# Patient Record
Sex: Female | Born: 1955 | Hispanic: No | Marital: Married | Smoking: Former smoker
Health system: Southern US, Community
[De-identification: ages and names within clinical notes are randomized; demographics above are authoritative.]

## PROBLEM LIST (undated history)

## (undated) DIAGNOSIS — R55 Syncope and collapse: Secondary | ICD-10-CM

## (undated) DIAGNOSIS — Z9289 Personal history of other medical treatment: Secondary | ICD-10-CM

## (undated) HISTORY — DX: Syncope and collapse: R55

## (undated) HISTORY — DX: Personal history of other medical treatment: Z92.89

## (undated) HISTORY — PX: APPENDECTOMY: SHX54

## (undated) HISTORY — PX: PARTIAL HYSTERECTOMY: SHX80

---

## 2008-07-29 ENCOUNTER — Encounter (INDEPENDENT_AMBULATORY_CARE_PROVIDER_SITE_OTHER): Payer: Self-pay | Admitting: General Surgery

## 2008-07-29 ENCOUNTER — Inpatient Hospital Stay (HOSPITAL_COMMUNITY): Admission: RE | Admit: 2008-07-29 | Discharge: 2008-08-01 | Payer: Self-pay | Admitting: General Surgery

## 2009-06-20 HISTORY — PX: COLON SURGERY: SHX602

## 2011-02-02 NOTE — Op Note (Signed)
Kristy Myers, Kristy Myers                ACCOUNT NO.:  [id number]   MEDICAL RECORD NO.:  [id number]          PATIENT TYPE:  INP   LOCATION:  NA                           FACILITY:  [facility]   PHYSICIAN:  Angelia Mould. Derrell Lolling, M.D.DATE OF BIRTH:  Dec 12, 1955   DATE OF PROCEDURE:  07/29/2008  DATE OF DISCHARGE:                               OPERATIVE REPORT   PREOPERATIVE DIAGNOSIS:  Villous adenoma of the cecum.   POSTOPERATIVE DIAGNOSIS:  Villous adenoma of the cecum.   OPERATION PERFORMED:  Laparoscopic-assisted right colectomy.   SURGEON:  Dr. Claud Kelp.   ASSISTANT:  Dr. Karie Soda.   OPERATIVE INDICATIONS:  This is a 55 year old white female who lost 8  pounds in 2 months and was otherwise feeling well.  Father died of  prostate cancer.  Recently she decided to get her first ever screening  colonoscopy.  Dr. Randa Evens performed a colonoscopy and found a villous  adenoma in the cecum related to the orifice of the appendix.  Biopsy  showed villous adenoma, no dysplasia.  She was advised to have this area  resected.  She has undergone a bowel prep at home and is brought to the  operating room electively.   OPERATIVE FINDINGS:  Visually there were no abnormal findings of the  liver, stomach, duodenum, small intestine, large intestine, or pelvis.  There were almost no adhesions in the pelvis from her previous ovarian  surgery.  She had a soft palpable mass in the cecum, about 2-3 cm in  diameter.  Dr. Colonel Bald in the pathology lab inspected the specimen.  It  seemed to have excellent margins on each side and that the villous  adenoma appeared to be about 3 cm in diameter.   OPERATIVE TECHNIQUE:  Following induction of general endotracheal  anesthesia, a Foley catheter was inserted.  The patient's abdomen was  prepped and draped in sterile fashion.  A time-out was held and the  patient was identified as the correct patient and the correct procedure.  Intravenous antibiotics were given.   The short vertically-oriented  incision was made at the superior rim of the umbilicus.  The fascia was  incised in the midline and the abdominal cavity entered under direct  vision.  An 11 mm Hassan trocar was inserted and secured with a  pursestring suture of 0 Vicryl.  Pneumoperitoneum was created.  Video  camera was inserted with visualization and findings as described above.  A 5 mm trocar was placed in the suprapubic area through the previous  Pfannenstiel incision.  A 5 mm trocar was placed in the right mid  abdomen, a 5 mm trocar in the left mid abdomen and 5 mm  trocar in the  upper midline about 6 cm away from the [place] trocar.   The patient was positioned.  We examined the terminal ileum and cecum.  Using the Harmonic scalpel and blunt dissection,  we mobilized the  terminal ileum and cecum away from lateral pelvic sidewall.  We  continued this dissection cephalad all the way up to the hepatic flexure  and we were able to  mobilize and sweep the right colon to the midline.  We then repositioned the patient and mobilized the right transverse  colon by dividing its lateral peritoneal attachments.  We were very  careful to identify the edge of the gallbladder and the duodenum as we  went through this and took this in small steps, but ultimately we were  able to fully mobilize the hepatic flexure down and away from the liver  and away from the duodenum.  We had the duodenum was completely  visualized.  At this point we judged our mobilization and we could bring  the cecum all the way up into the left upper quadrant.  The  pneumoperitoneum was released.  Trocars in the midline were removed.  The incision above the umbilicus extended cephalad for a distance about  6 or 7 cm.  The fascia was incised in the midline.  A self retaining  retractor was placed.  We delivered the terminal ileum and right colon  into the wound.  We could identify the ileocolic vessels, the right  colic  vessels and the right and left branch of the middle colic vessel.  We created a window just to the right of the right branch of the middle  colic vessel.  We transected the right transverse colon with a GIA  stapling device.  We then cleaned off the mesentery of the terminal  ileum about 2 inches proximal to the ileocecal valve, and we transected  the terminal ileum with a GIA stapling device.  We took the mesentery  widely down for a very proximal ligation of the ileocolic vessels.  Smaller mesenteric vessels were controlled with the LigaSure device.  The large ileocolic was clamped, divided and ligated with 2-0 silk ties.   The specimen was removed.  Specimen was sent to the lab.  Dr. Colonel Bald  did  a gross inspection of the  specimen as described above.  We had good  margins and a 3 cm tumor in the cecum.   Anastomosis was created between the terminal ileum and the mid  transverse colon using a GIA stapling device.  The defect of the bowel  wall was closed with TA-60 stapling device.   At this point we changed our instruments and gloves and suction devices.  A few 3-0 silk sutures were placed to reinforce the staple line at  critical points.  There was no bleeding from the staple line.  It  appeared intact.  The mesentery was closed with multiple interrupted  figure-of-eight sutures of 3-0 silk.  We irrigated off the specimen,  returned to the abdominal cavity.  The midline fascia was closed with  running suture of #1 double-stranded PDS and skin closed with skin  staples.  Pneumoperitoneum was recreated.  We inserted a 5 mm camera to  inspect all the areas of operation.  There was absolutely no blood and  almost no fluid which was evacuated.  We could see the anastomosis.  We  could see the terminal ileum and there was no twisting or kinking  anywhere.  We removed what little irrigation fluid there was and placed  the omentum down on top of anastomosis.  The pneumoperitoneum was   released.  This trocars were removed.  All the trocar sites were closed  with skin staples as well.   The patient tolerated the procedure well and was taken recovery room in  stable condition.  Estimated blood loss was about 75 mL or less.  Complications none.  Sponge, needle and instrument counts were correct.      Angelia Mould. Derrell Lolling, M.D.  Electronically Signed     HMI/MEDQ  D:  07/29/2008  T:  07/29/2008  Job:  [id number]   cc:   Fayrene Fearing L. Malon Kindle., M.D.  Fax: [phone]   Delaney Meigs, M.D.  Fax: [phone]

## 2011-02-05 NOTE — Discharge Summary (Signed)
Kristy Myers, Kristy Myers                ACCOUNT NO.:  [id number]   MEDICAL RECORD NO.:  [id number]          PATIENT TYPE:  INP   LOCATION:  1529                         FACILITY:  [facility]   PHYSICIAN:  Angelia Mould. 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   OPERATION PERFORMED:  Laparoscopic assisted right colectomy date  July 29, 2008.   HISTORY:  This is a 55 year old white female who is asymptomatic.  She  elected to have screening colonoscopy.  Dr. Randa Evens performed this and  he found a prominent area at the orifice of the appendix and in the  cecum.  Biopsy showed tubulovillous adenoma and no dysplasia was noted  on that biopsy.  He could not resect this area because of its location  and its sessile nature.  She was sent to me and counseled as an  outpatient.  Surgery was advised.  She underwent a bowel prep at home  and was brought to the operating room electively.   HOSPITAL COURSE:  On the day of admission the patient was taken to the  operating room and underwent a laparoscopic assisted right colectomy.  We found a 3 cm tumor in the cecum.  The final pathology report showed a  2.5 cm x 1.6 x 0.5 cm sessile tubulovillous adenoma with one focus of  high-grade dysplasia.  There was no invasive cancer identified.  Thirty  colonic lymph nodes were negative for tumor.   Postoperatively the patient did well.  Her pathology report was  discussed with her.  She advanced in diet and activities and was ready  for discharge on August 01, 2008.  At that time she had had one bowel  movement and was voiding reasonably well and ambulating in the halls and  wanted to go home.  Her wounds looked good.  She was given a  prescription for Vicodin for pain.  She was told to continue her  usual  medications which are none and she was asked to return to see me in the  office in 1 week.      Angelia Mould. Derrell Lolling, M.D.  Electronically Signed     HMI/MEDQ  D:  08/23/2008  T:  08/23/2008  Job:  [id number]   cc:   Fayrene Fearing L. Malon Kindle., M.D.  Fax: [phone]   Delaney Meigs, M.D.  Fax: [phone]

## 2011-04-30 ENCOUNTER — Emergency Department (HOSPITAL_COMMUNITY): Payer: BC Managed Care – PPO

## 2011-04-30 ENCOUNTER — Emergency Department (HOSPITAL_COMMUNITY)
Admission: EM | Admit: 2011-04-30 | Discharge: 2011-04-30 | Disposition: A | Discharge status: Home / Self Care | Payer: BC Managed Care – PPO | Attending: Emergency Medicine | Admitting: Emergency Medicine

## 2011-04-30 DIAGNOSIS — M25579 Pain in unspecified ankle and joints of unspecified foot: Secondary | ICD-10-CM | POA: Insufficient documentation

## 2011-04-30 DIAGNOSIS — R55 Syncope and collapse: Secondary | ICD-10-CM | POA: Insufficient documentation

## 2011-04-30 DIAGNOSIS — S82899A Other fracture of unspecified lower leg, initial encounter for closed fracture: Secondary | ICD-10-CM | POA: Insufficient documentation

## 2011-04-30 DIAGNOSIS — R296 Repeated falls: Secondary | ICD-10-CM | POA: Insufficient documentation

## 2011-04-30 DIAGNOSIS — M79609 Pain in unspecified limb: Secondary | ICD-10-CM | POA: Insufficient documentation

## 2011-04-30 DIAGNOSIS — S92919A Unspecified fracture of unspecified toe(s), initial encounter for closed fracture: Secondary | ICD-10-CM | POA: Insufficient documentation

## 2011-04-30 DIAGNOSIS — R51 Headache: Secondary | ICD-10-CM | POA: Insufficient documentation

## 2011-04-30 LAB — DIFFERENTIAL
Basophils Absolute: 0 10*3/uL (ref 0.0–0.1)
Eosinophils Relative: 2 % (ref 0–5)
Monocytes Absolute: 0.7 10*3/uL (ref 0.1–1.0)
Monocytes Relative: 7 % (ref 3–12)
Neutro Abs: 5.6 10*3/uL (ref 1.7–7.7)

## 2011-04-30 LAB — POCT I-STAT, CHEM 8
BUN: 10 mg/dL (ref 6–23)
Calcium, Ion: 1.18 mmol/L (ref 1.12–1.32)
Creatinine, Ser: 0.9 mg/dL (ref 0.50–1.10)
Potassium: 4.1 mEq/L (ref 3.5–5.1)

## 2011-04-30 LAB — CBC
HCT: 38.7 % (ref 36.0–46.0)
Hemoglobin: 13.2 g/dL (ref 12.0–15.0)
MCV: 93 fL (ref 78.0–100.0)
RDW: 13.3 % (ref 11.5–15.5)

## 2011-04-30 LAB — POCT I-STAT TROPONIN I: Troponin i, poc: 0 ng/mL (ref 0.00–0.08)

## 2011-06-11 DIAGNOSIS — Z9289 Personal history of other medical treatment: Secondary | ICD-10-CM

## 2011-06-11 HISTORY — DX: Personal history of other medical treatment: Z92.89

## 2011-06-23 LAB — COMPREHENSIVE METABOLIC PANEL
ALT: 12
Albumin: 3.9
Alkaline Phosphatase: 50
BUN: 9
CO2: 29
Calcium: 9.1
Glucose, Bld: 87
Sodium: 141

## 2011-06-23 LAB — CBC
HCT: 37
HCT: 38.6
MCHC: 34
Platelets: 245
Platelets: 287
RDW: 12.8
RDW: 12.8
WBC: 15.7 — ABNORMAL HIGH
WBC: 7.3

## 2011-06-23 LAB — DIFFERENTIAL
Basophils Absolute: 0
Lymphocytes Relative: 39

## 2011-06-23 LAB — BASIC METABOLIC PANEL
BUN: 8
Calcium: 8.6
Glucose, Bld: 163 — ABNORMAL HIGH
Sodium: 134 — ABNORMAL LOW

## 2011-06-23 LAB — URINALYSIS, ROUTINE W REFLEX MICROSCOPIC
Urobilinogen, UA: 1
pH: 6.5

## 2011-06-23 LAB — URINE MICROSCOPIC-ADD ON

## 2012-07-30 IMAGING — CR DG FOOT COMPLETE 3+V*L*
4 series · 4 of 4 positions shown · non-contrast
Comparison: None.

CLINICAL DATA: Bilateral foot pain.  Right-sided ankle pain.
Syncopal episode.

LEFT FOOT - COMPLETE 3+ VIEW

[x foot ap left]
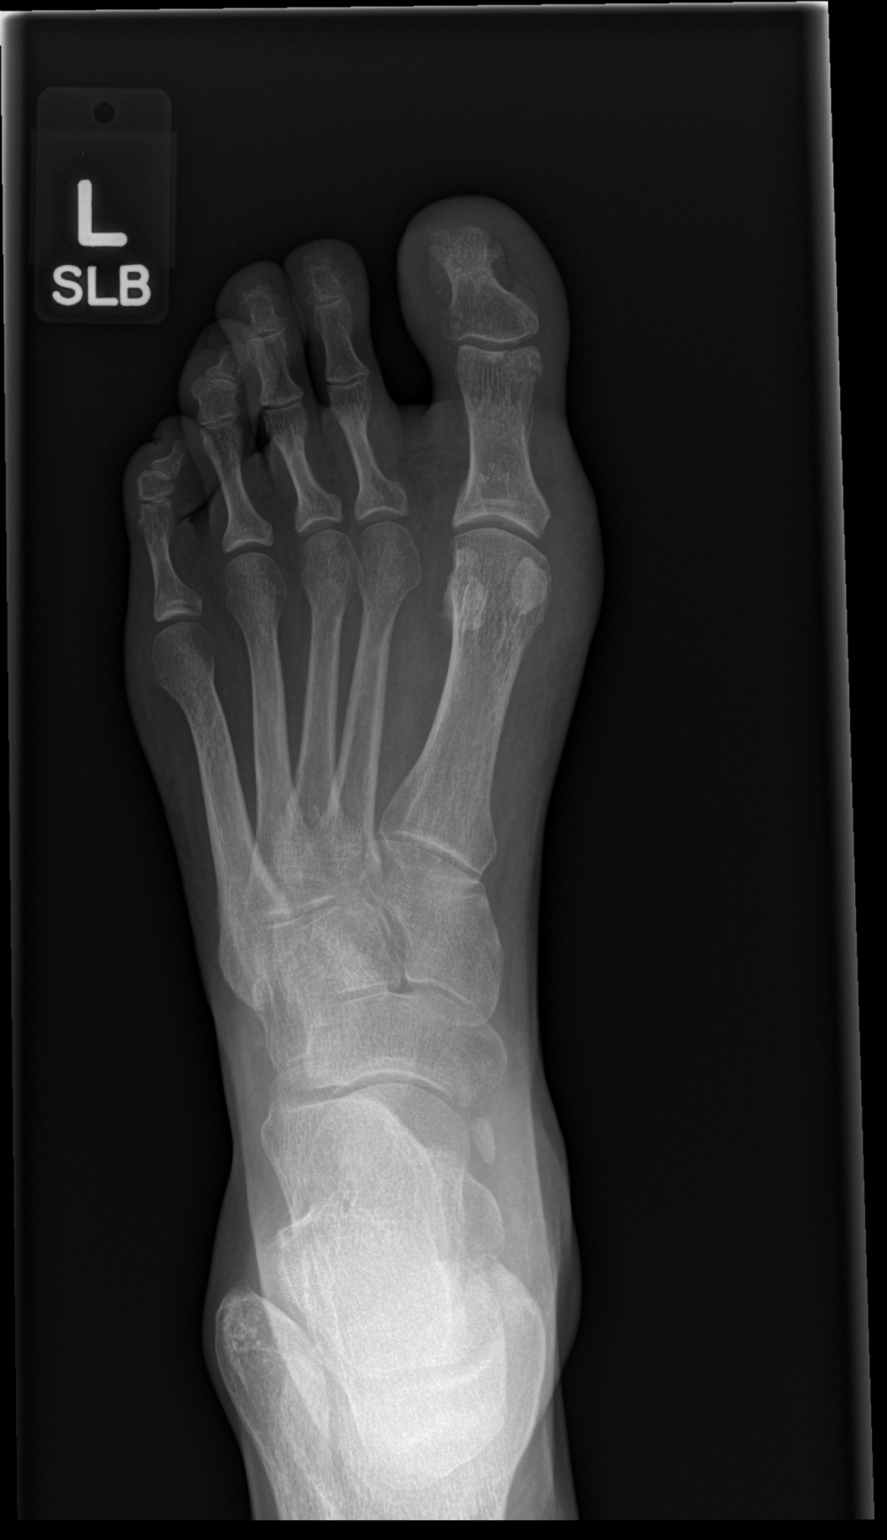

[x foot obl left (1 of 2)]
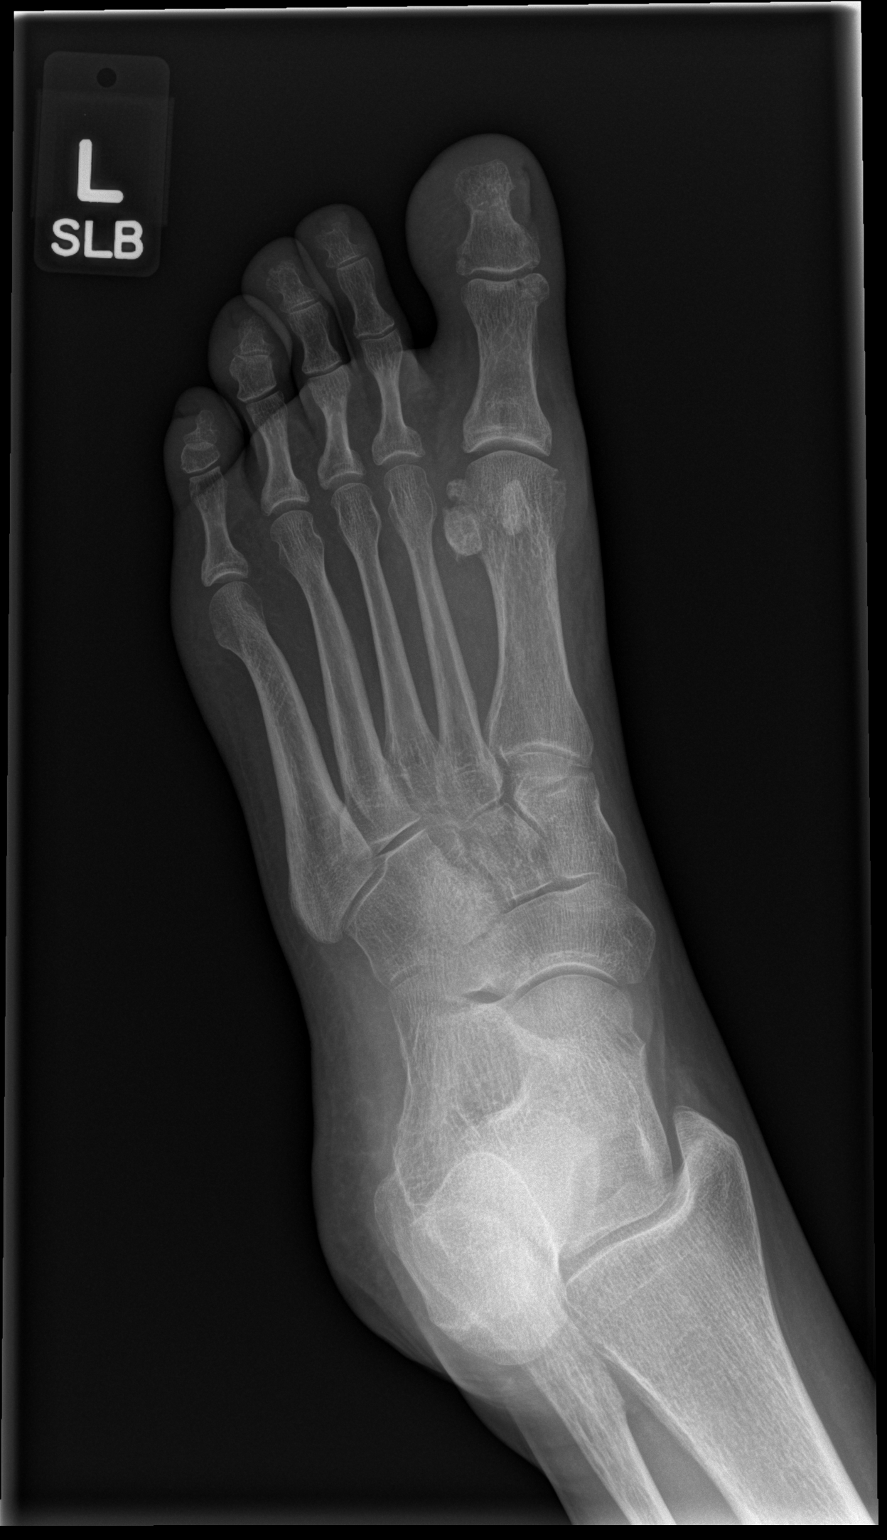

[x foot obl left (2 of 2)]
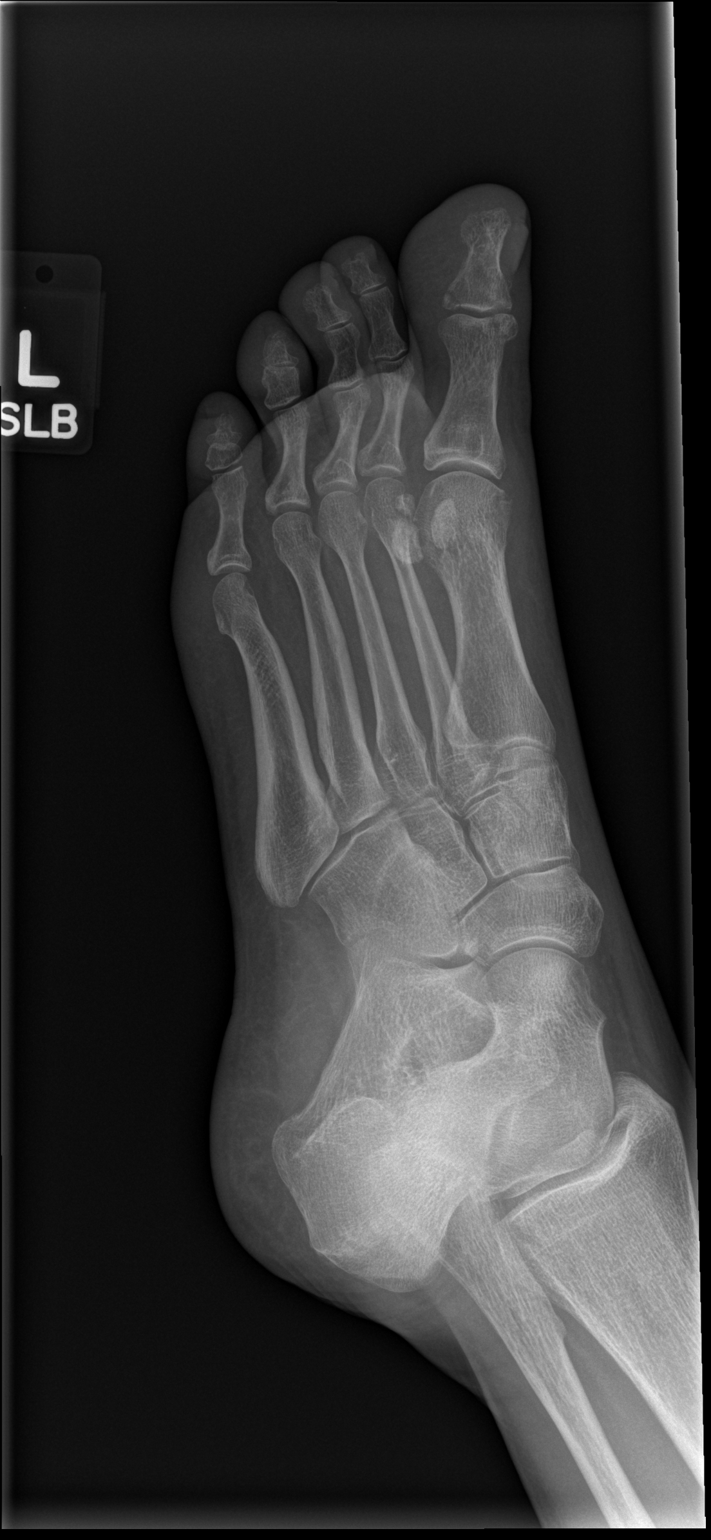

[x foot lat left]
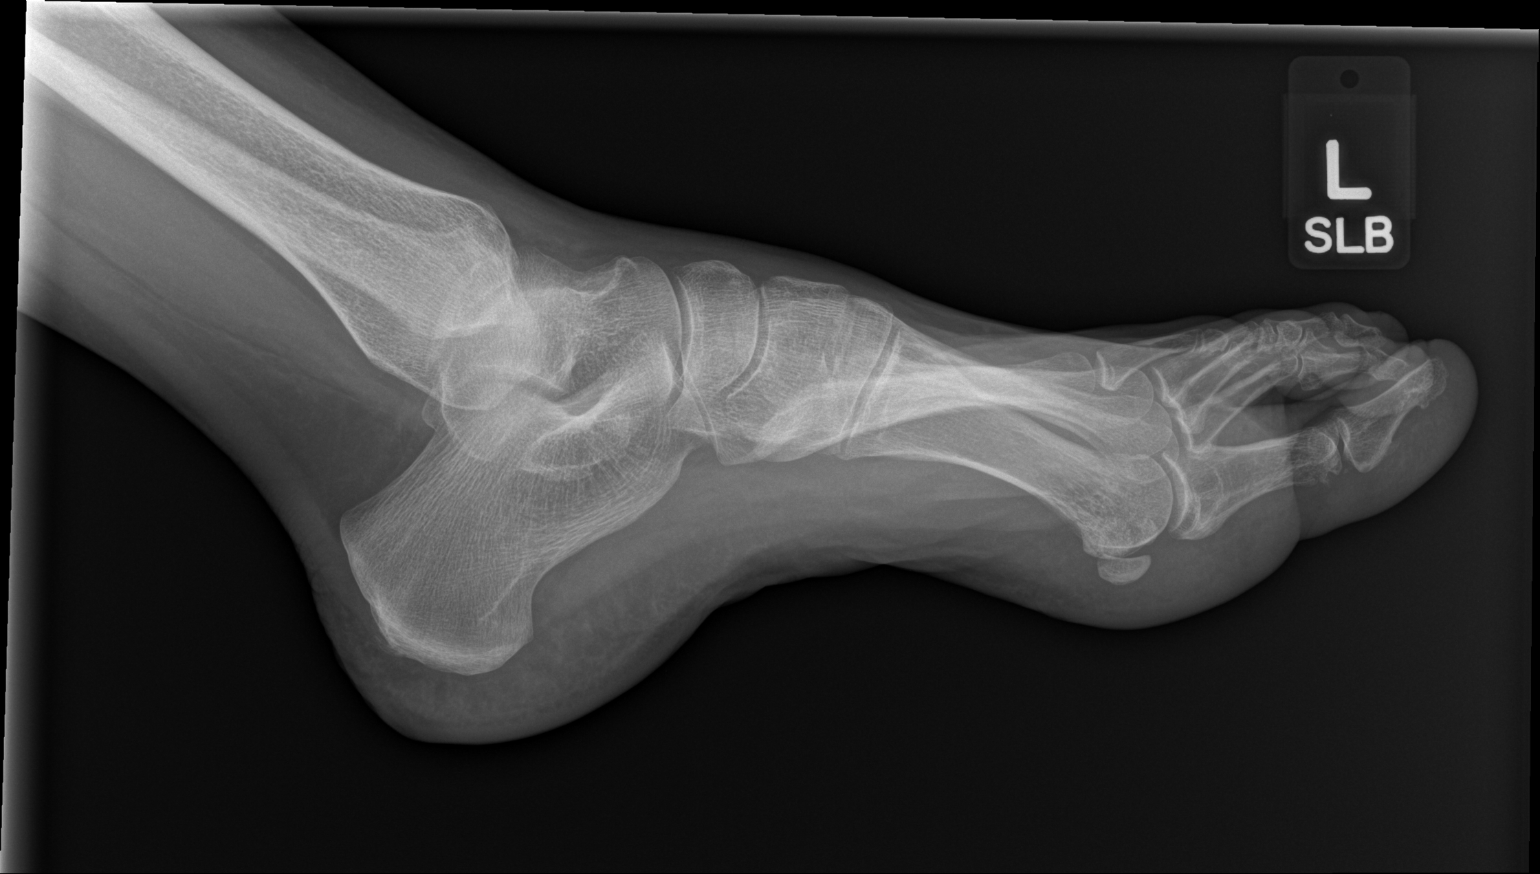

[4 of 4 positions shown; findings below may reference images not displayed]

FINDINGS: Nondisplaced fracture of the medial distal aspect of the
proximal phalanx of the left great toe with intra-articular
extension.  Minimal cortical step-off deformity is present of the
articular surface at the great toe IP joint.  No other fractures
are identified.  Soft tissue swelling is present over the medial
aspect of the great toe.  Probable bipartite lateral great toe
sesamoid versus old sesamoid fracture.

There is another fracture of the lateral and plantar surface of the
terminal phalanx of the great toe with intra-articular extension.
No cortical step-off deformity.
IMPRESSION: 1. Nondisplaced fracture of the distal proximal phalanx of the left
great toe with intra-articular extension and mild cortical step-
off.  Cortical step-off measures under 1 mm.
2.  Nondisplaced fracture of the lateral aspect of the terminal
phalanx of the left great toe with intra-articular extension.

## 2013-03-12 ENCOUNTER — Other Ambulatory Visit (HOSPITAL_COMMUNITY): Payer: Self-pay | Admitting: Family Medicine

## 2013-03-12 DIAGNOSIS — R634 Abnormal weight loss: Secondary | ICD-10-CM

## 2013-03-14 ENCOUNTER — Ambulatory Visit (HOSPITAL_COMMUNITY)
Admission: RE | Admit: 2013-03-14 | Discharge: 2013-03-14 | Disposition: A | Discharge status: Home / Self Care | Payer: BC Managed Care – PPO | Source: Ambulatory Visit | Attending: Family Medicine | Admitting: Family Medicine

## 2013-03-14 DIAGNOSIS — N281 Cyst of kidney, acquired: Secondary | ICD-10-CM | POA: Insufficient documentation

## 2013-03-14 DIAGNOSIS — R634 Abnormal weight loss: Secondary | ICD-10-CM

## 2013-03-20 ENCOUNTER — Other Ambulatory Visit (HOSPITAL_COMMUNITY): Payer: Self-pay | Admitting: Family Medicine

## 2013-03-20 DIAGNOSIS — R634 Abnormal weight loss: Secondary | ICD-10-CM

## 2013-03-22 ENCOUNTER — Ambulatory Visit (HOSPITAL_COMMUNITY): Payer: BC Managed Care – PPO

## 2013-03-29 ENCOUNTER — Ambulatory Visit (HOSPITAL_COMMUNITY)
Admission: RE | Admit: 2013-03-29 | Discharge: 2013-03-29 | Disposition: A | Discharge status: Home / Self Care | Payer: BC Managed Care – PPO | Source: Ambulatory Visit | Attending: Family Medicine | Admitting: Family Medicine

## 2013-03-29 DIAGNOSIS — R634 Abnormal weight loss: Secondary | ICD-10-CM | POA: Insufficient documentation

## 2013-03-29 DIAGNOSIS — R1011 Right upper quadrant pain: Secondary | ICD-10-CM | POA: Insufficient documentation

## 2013-03-29 DIAGNOSIS — R110 Nausea: Secondary | ICD-10-CM | POA: Insufficient documentation

## 2013-04-17 ENCOUNTER — Other Ambulatory Visit (HOSPITAL_COMMUNITY): Payer: Self-pay | Admitting: Adult Health Nurse Practitioner

## 2013-04-17 DIAGNOSIS — R1011 Right upper quadrant pain: Secondary | ICD-10-CM

## 2013-04-18 ENCOUNTER — Encounter (HOSPITAL_COMMUNITY): Payer: Self-pay

## 2013-04-18 ENCOUNTER — Ambulatory Visit (HOSPITAL_COMMUNITY)
Admission: RE | Admit: 2013-04-18 | Discharge: 2013-04-18 | Disposition: A | Discharge status: Home / Self Care | Payer: BC Managed Care – PPO | Source: Ambulatory Visit | Attending: Adult Health Nurse Practitioner | Admitting: Adult Health Nurse Practitioner

## 2013-04-18 DIAGNOSIS — R1011 Right upper quadrant pain: Secondary | ICD-10-CM | POA: Insufficient documentation

## 2013-04-18 MED ORDER — TECHNETIUM TC 99M MEBROFENIN IV KIT
5.0000 | PACK | Freq: Once | INTRAVENOUS | Status: AC | PRN
  Administered 2013-04-18: 5 via INTRAVENOUS

## 2013-07-13 ENCOUNTER — Encounter: Payer: Self-pay | Admitting: *Deleted

## 2013-07-16 ENCOUNTER — Ambulatory Visit (INDEPENDENT_AMBULATORY_CARE_PROVIDER_SITE_OTHER): Payer: BC Managed Care – PPO | Admitting: Internal Medicine

## 2013-07-16 ENCOUNTER — Encounter: Payer: Self-pay | Admitting: Internal Medicine

## 2013-07-16 VITALS — BP 138/62 | HR 55 | Ht 66.0 in | Wt 119.3 lb

## 2013-07-16 DIAGNOSIS — R079 Chest pain, unspecified: Secondary | ICD-10-CM | POA: Insufficient documentation

## 2013-07-16 DIAGNOSIS — D689 Coagulation defect, unspecified: Secondary | ICD-10-CM

## 2013-07-16 DIAGNOSIS — R0602 Shortness of breath: Secondary | ICD-10-CM

## 2013-07-16 DIAGNOSIS — R5383 Other fatigue: Secondary | ICD-10-CM

## 2013-07-16 DIAGNOSIS — R002 Palpitations: Secondary | ICD-10-CM | POA: Insufficient documentation

## 2013-07-16 DIAGNOSIS — Z01818 Encounter for other preprocedural examination: Secondary | ICD-10-CM

## 2013-07-16 DIAGNOSIS — R55 Syncope and collapse: Secondary | ICD-10-CM

## 2013-07-16 DIAGNOSIS — R5381 Other malaise: Secondary | ICD-10-CM

## 2013-07-16 NOTE — Progress Notes (Signed)
OFFICE NOTE  Chief Complaint:  Syncope, chest pain, diaphoresis, palpitations  Primary Care Physician: Josue Hector, MD  HPI:  Kristy Myers is a 57 year old female from [place] originally. She had an episode 2 years ago, where she was out with her husband on a back deck at a friend's and noted an episode of nausea and a mild sweatiness, as well as feeling hot all over. Then, she felt dizzy. She told her husband that she was going to sit down, turned around, and was then noted to have a syncopal episode, falling to her knees and then backward. During this episode, apparently she suffered a fracture of her right foot and is now in a walking cast. Prior to the episode, she denies any chest pain or pressure or any tachypalpitations or feelings of her heart racing. She has never had episodes like this before, and she has not had any recent illness, infection, medication changes, or any other associated events. She did have a history of smoking and had recently quit smoking at that time. I suspect that her episode was related to vasovagal syncope, but she did have some atypical chest pain, nausea and mild upper chest pressure. I felt that her symptoms were likely GI in origin and possibly had a vasovagal episode causing syncope. I did order a nuclear stress test at that time in September 2012 which was negative for ischemia. She did not have another episode until about 6 months ago when she had a presyncopal event and then this past Saturday she was at [facility] and had an episode where she started to have profuse sweating, shortness of breath and chest pain and was presyncopal. She noted nausea status status and loss of appetite, fatigue, decreased color and has continued to feel poor since the episode last week.  She reports no limitation to her activities. Her chest pain is not associated with exertion or relieved by rest, but she does note shortness of breath and fatigue with exertion  which is new. She apparently has had blood work obtained recently which does not show any anemia, thyroid abnormalities or anything other than a mild B12 deficiency.  PMHx:  Past Medical History  Diagnosis Date  . Syncope   . History of nuclear stress test 06/11/2011    lexiscan; normal pattern of perfusion, low risk scan     Past Surgical History  Procedure Laterality Date  . Partial hysterectomy    . Appendectomy    . Colon surgery  06/2009    FAMHx:  Family History  Problem Relation Age of Onset  . Cancer Father   . Heart Problems Maternal Grandmother   . Other Mother     sepsis    SOCHx:   reports that she quit smoking about 2 years ago. She has never used smokeless tobacco. She reports that she does not drink alcohol or use illicit drugs.  ALLERGIES:  Allergies  Allergen Reactions  . Codeine     ROS: A comprehensive review of systems was negative except for: Constitutional: positive for fatigue Respiratory: positive for dyspnea on exertion Cardiovascular: positive for near-syncope, palpitations and syncope  HOME MEDS: Current Outpatient Prescriptions  Medication Sig Dispense Refill  . ibuprofen (ADVIL,MOTRIN) 800 MG tablet Take 800 mg by mouth every 8 (eight) hours as needed for pain.      Marland Kitchen omeprazole (PRILOSEC) 20 MG capsule Take 20 mg by mouth 2 (two) times daily.      . ondansetron (ZOFRAN) 4 MG  tablet Take 4 mg by mouth every 6 (six) hours as needed for nausea.      Marland Kitchen zolpidem (AMBIEN) 10 MG tablet Take 10 mg by mouth at bedtime as needed for sleep.       No current facility-administered medications for this visit.    LABS/IMAGING: No results found for this or any previous visit (from the past 48 hour(s)). No results found.  VITALS: BP 138/62  Pulse 55  Ht 5\' 6"  (1.676 m)  Wt 119 lb 4.8 oz (54.114 kg)  BMI 19.26 kg/m2  EXAM: General appearance: alert and no distress Neck: no carotid bruit and no JVD Lungs: decreased breath sounds  bilaterally Heart: regular rate and rhythm, S1, S2 normal, no murmur, click, rub or gallop Abdomen: soft, non-tender; bowel sounds normal; no masses,  no organomegaly Extremities: extremities normal, atraumatic, no cyanosis or edema Pulses: 2+ and symmetric Skin: Skin color, texture, turgor normal. No rashes or lesions Neurologic: Grossly normal Psych: Mood, affect normal  EKG: Sinus bradycardia 55, no preexcitation or ischemic changes  ASSESSMENT: 1. 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 Chest pain associated with the syncopal episode 3. Palpitations 4. History of tobacco abuse-quit in 2012 5. Reflux and possible cholestasis  PLAN: 1.   Kristy Myers has had another presyncopal episode and has previously had a syncopal episode which resulted in a fall and a fracture. She had never had any of these episodes until 2 years ago. She does have a smoking history and a history of electrical conduction disease and a grandmother who had coronary disease as well as a pacemaker. She had a negative stress test 2 years ago with a similar presentation, and her symptoms could certainly be related to coronary disease. I'm concerned that a stress test will not answer the question as to whether or not she has coronary disease, definitively. She certainly could have a coronary lesion on the right circumstances causing her symptoms. He'll then likely that this coronary lesion would've caused her symptoms 2 years ago, however the episodes now are more frequent and could be explained by coronary disease. If this is excluded, then the more likely explanation would be vasovagal syncope or possibly paroxysmal arrhythmia. In this situation, an implanted loop recorder may be beneficial. She does have a smoking history and therefore like to obtain a chest x-ray and pulmonary function tests for decreased breath sounds.  We'll schedule this over the next few weeks.  Thanks for referring her back for ongoing  work-up of her syncope.  Chrystie Nose, MD, [facility] Attending Cardiologist [facility]  Kymari Lollis C 07/16/2013, 3:28 PM

## 2013-07-16 NOTE — Patient Instructions (Addendum)
Your physician has requested that you have a cardiac catheterization. Cardiac catheterization is used to diagnose and/or treat various heart conditions. Doctors may recommend this procedure for a number of different reasons. The most common reason is to evaluate chest pain. Chest pain can be a symptom of coronary artery disease (CAD), and cardiac catheterization can show whether plaque is narrowing or blocking your heart's arteries. This procedure is also used to evaluate the valves, as well as measure the blood flow and oxygen levels in different parts of your heart. For further information please visit [email or web address] Please follow instruction sheet, as given.  This is done at [facility].  You will need to have some pre-procedure blood work (fasting) and a chest x-ray prior.  [address] [employer] analyst)

## 2013-07-17 ENCOUNTER — Other Ambulatory Visit: Payer: Self-pay | Admitting: *Deleted

## 2013-07-17 DIAGNOSIS — Z01818 Encounter for other preprocedural examination: Secondary | ICD-10-CM

## 2013-07-18 ENCOUNTER — Telehealth: Payer: Self-pay | Admitting: Internal Medicine

## 2013-07-18 ENCOUNTER — Ambulatory Visit
Admission: RE | Admit: 2013-07-18 | Discharge: 2013-07-18 | Disposition: A | Discharge status: Home / Self Care | Payer: BC Managed Care – PPO | Source: Ambulatory Visit | Attending: Internal Medicine | Admitting: Internal Medicine

## 2013-07-18 DIAGNOSIS — Z01818 Encounter for other preprocedural examination: Secondary | ICD-10-CM

## 2013-07-18 LAB — CBC
HCT: 38.1 % (ref 36.0–46.0)
MCH: 30.4 pg (ref 26.0–34.0)
MCHC: 34.9 g/dL (ref 30.0–36.0)
MCV: 87 fL (ref 78.0–100.0)
Platelets: 348 10*3/uL (ref 150–400)
RBC: 4.38 MIL/uL (ref 3.87–5.11)
WBC: 6.9 10*3/uL (ref 4.0–10.5)

## 2013-07-18 LAB — LIPID PANEL
HDL: 35 mg/dL — ABNORMAL LOW (ref >39)
LDL Cholesterol: 114 mg/dL — ABNORMAL HIGH (ref 0–99)
Total CHOL/HDL Ratio: 4.8 Ratio
Triglycerides: 94 mg/dL (ref <150)

## 2013-07-18 LAB — BASIC METABOLIC PANEL
BUN: 12 mg/dL (ref 6–23)
CO2: 27 mEq/L (ref 19–32)
Chloride: 106 mEq/L (ref 96–112)
Creat: 0.8 mg/dL (ref 0.50–1.10)

## 2013-07-18 LAB — PROTIME-INR: INR: 1.05 (ref <1.50)

## 2013-07-18 NOTE — Telephone Encounter (Signed)
Please call-concerning procedure she is suppose to have.

## 2013-07-18 NOTE — Telephone Encounter (Signed)
Message forwarded to J. Elkins, RN.  

## 2013-07-18 NOTE — Telephone Encounter (Signed)
Returned Kristy Myers's call - she was wondering which procedure was being done next Wednesday and wanted to make sure she had done all the pre-procedure tests - informed patient that labs/cxr was all she needed to do for the cardiac cath and that Dr. Rennis Golden will review those results. Patient verbalized understanding - no further questions.

## 2013-07-19 ENCOUNTER — Encounter (HOSPITAL_COMMUNITY): Payer: Self-pay

## 2013-07-25 ENCOUNTER — Encounter (HOSPITAL_COMMUNITY)
Admission: RE | Disposition: A | Discharge status: Home / Self Care | Payer: Self-pay | Source: Ambulatory Visit | Attending: Internal Medicine

## 2013-07-25 ENCOUNTER — Ambulatory Visit (HOSPITAL_COMMUNITY)
Admission: RE | Admit: 2013-07-25 | Discharge: 2013-07-25 | Disposition: A | Discharge status: Home / Self Care | Payer: BC Managed Care – PPO | Source: Ambulatory Visit | Attending: Internal Medicine | Admitting: Internal Medicine

## 2013-07-25 DIAGNOSIS — I200 Unstable angina: Secondary | ICD-10-CM | POA: Insufficient documentation

## 2013-07-25 DIAGNOSIS — R55 Syncope and collapse: Secondary | ICD-10-CM | POA: Insufficient documentation

## 2013-07-25 DIAGNOSIS — R61 Generalized hyperhidrosis: Secondary | ICD-10-CM | POA: Insufficient documentation

## 2013-07-25 DIAGNOSIS — R5383 Other fatigue: Secondary | ICD-10-CM

## 2013-07-25 DIAGNOSIS — R079 Chest pain, unspecified: Secondary | ICD-10-CM

## 2013-07-25 DIAGNOSIS — R002 Palpitations: Secondary | ICD-10-CM

## 2013-07-25 DIAGNOSIS — Z01818 Encounter for other preprocedural examination: Secondary | ICD-10-CM

## 2013-07-25 HISTORY — PX: LEFT HEART CATHETERIZATION WITH CORONARY ANGIOGRAM: SHX5451

## 2013-07-25 SURGERY — LEFT HEART CATHETERIZATION WITH CORONARY ANGIOGRAM
Anesthesia: LOCAL

## 2013-07-25 MED ORDER — SODIUM CHLORIDE 0.9 % IV SOLN: 1.0000 mL/kg/h | INTRAVENOUS | Status: DC

## 2013-07-25 MED ORDER — NITROGLYCERIN 0.2 MG/ML ON CALL CATH LAB
INTRAVENOUS | Status: AC
  Filled 2013-07-25: qty 1

## 2013-07-25 MED ORDER — SILVER SULFADIAZINE 1 % EX CREA
TOPICAL_CREAM | Freq: Two times a day (BID) | CUTANEOUS | Status: DC
  Filled 2013-07-25: qty 85

## 2013-07-25 MED ORDER — ONDANSETRON HCL 4 MG/2ML IJ SOLN: 4.0000 mg | Freq: Four times a day (QID) | INTRAMUSCULAR | Status: DC | PRN

## 2013-07-25 MED ORDER — SODIUM CHLORIDE 0.9 % IJ SOLN: 3.0000 mL | INTRAMUSCULAR | Status: DC | PRN

## 2013-07-25 MED ORDER — ASPIRIN 81 MG PO CHEW
81.0000 mg | CHEWABLE_TABLET | ORAL | Status: AC
  Administered 2013-07-25: 81 mg via ORAL
  Filled 2013-07-25: qty 1

## 2013-07-25 MED ORDER — MIDAZOLAM HCL 2 MG/2ML IJ SOLN
INTRAMUSCULAR | Status: AC
  Filled 2013-07-25: qty 2

## 2013-07-25 MED ORDER — SODIUM CHLORIDE 0.9 % IV SOLN
INTRAVENOUS | Status: DC
  Administered 2013-07-25: 07:00:00 via INTRAVENOUS

## 2013-07-25 MED ORDER — HEPARIN (PORCINE) IN NACL 2-0.9 UNIT/ML-% IJ SOLN
INTRAMUSCULAR | Status: AC
  Filled 2013-07-25: qty 1000

## 2013-07-25 MED ORDER — FENTANYL CITRATE 0.05 MG/ML IJ SOLN
INTRAMUSCULAR | Status: AC
  Filled 2013-07-25: qty 2

## 2013-07-25 MED ORDER — ACETAMINOPHEN 325 MG PO TABS: 650.0000 mg | ORAL_TABLET | ORAL | Status: DC | PRN

## 2013-07-25 MED ORDER — LIDOCAINE HCL (PF) 1 % IJ SOLN
INTRAMUSCULAR | Status: AC
  Filled 2013-07-25: qty 30

## 2013-07-25 NOTE — Progress Notes (Signed)
Discharge instruction given per MD order.  Pt and CG able to verbalize understanding.  Dr Rennis Golden called and made aware of pt wanting to leave 15 mins early and approved pt leaving.     Pt to car via wheelchair and denies any discomfort at this time.

## 2013-07-25 NOTE — H&P (Signed)
    INTERVAL PROCEDURE H&P  History and Physical Interval Note:  07/25/2013 8:06 AM  Benson Setting has presented today for their planned procedure. The various methods of treatment have been discussed with the patient and family. After consideration of risks, benefits and other options for treatment, the patient has consented to the procedure.  The patients' outpatient history has been reviewed, patient examined, and no change in status from most recent office note within the past 30 days. I have reviewed the patients' chart and labs and will proceed as planned. Questions were answered to the patient's satisfaction.   Cath Lab Visit (complete for each Cath Lab visit)  Clinical Evaluation Leading to the Procedure:   ACS: no  Non-ACS:    Anginal Classification: CCS II  Anti-ischemic medical therapy: Minimal Therapy (1 class of medications)  Non-Invasive Test Results: No non-invasive testing performed  Prior CABG: No previous CABG  Chrystie Nose, MD, [facility] Attending Cardiologist [facility]  Zeya Balles C 07/25/2013, 8:06 AM

## 2013-07-25 NOTE — CV Procedure (Signed)
CARDIAC CATHETERIZATION REPORT  Kristy Myers   [id number] 07-21-1956  Performing Cardiologist: Chrystie Nose Primary Physician: Josue Hector, MD Primary Cardiologist: Dr. Rennis Golden  Procedures Performed:  Left Heart Catheterization via 5 Fr left femoral artery access  Left Ventriculography, (RAO/LAO) 15 ml/sec for 30 ml total contrast  Native Coronary Angiography   Indication(s): chest pain, syncope and diaphoresis  Pre-Procedural Diagnosis(es):  1. Unstable angina 2. Syncope  Post-Procedural Diagnosis(es): 1. Angiographically normal coronary arteries 2. Possible vasovagal syncope 3. Possible symptomatic bradyarrythmia  Pre-Procedural Non-invasive testing: Stress test 2012 was negative  History: 57 y.o. female is a 57 year old female from [place] originally. She had an episode 2 years ago, where she was out with her husband on a back deck at a friend's and noted an episode of nausea and a mild sweatiness, as well as feeling hot all over. Then, she felt dizzy. She told her husband that she was going to sit down, turned around, and was then noted to have a syncopal episode, falling to her knees and then backward. During this episode, apparently she suffered a fracture of her right foot and is now in a walking cast. Prior to the episode, she denies any chest pain or pressure or any tachypalpitations or feelings of her heart racing. She has never had episodes like this before, and she has not had any recent illness, infection, medication changes, or any other associated events. She did have a history of smoking and had recently quit smoking at that time. I suspect that her episode was related to vasovagal syncope, but she did have some atypical chest pain, nausea and mild upper chest pressure. I felt that her symptoms were likely GI in origin and possibly had a vasovagal episode causing syncope. I did order a nuclear stress test at that time in September 2012 which was  negative for ischemia. She did not have another episode until about 6 months ago when she had a presyncopal event and then this past Saturday she was at [facility] and had an episode where she started to have profuse sweating, shortness of breath and chest pain and was presyncopal. She noted nausea status status and loss of appetite, fatigue, decreased color and has continued to feel poor since the episode last week. She reports no limitation to her activities. Her chest pain is not associated with exertion or relieved by rest, but she does note shortness of breath and fatigue with exertion which is new. She apparently has had blood work obtained recently which does not show any anemia, thyroid abnormalities or anything other than a mild B12 deficiency.   Risks / Complications include, but not limited to: Death, MI, CVA/TIA, VF/VT (with defibrillation), Bradycardia (need for temporary pacer placement), contrast induced nephropathy, bleeding / bruising / hematoma / pseudoaneurysm, vascular or coronary injury (with possible emergent CT or Vascular Surgery), adverse medication reactions, infection.    Consent: Risks of procedure as well as the alternatives and risks of each were explained to the (patient/caregiver).  Consent for procedure obtained.  Procedure: The patient was brought to the 2nd Floor [facility] in the fasting state and prepped and draped in the usual sterile fashion for (Right groin) access. Radial catheterization was not attempted due to the fact the patient burned her right wrist a few days ago.  Time Out: Verified patient identification, verified procedure, site/side was marked, verified correct patient position, special equipment/implants available, radiation safety measures in place (including badges and shielding), medications/allergies/relevent history  reviewed, required imaging and test results available.  Performed  Procedure: The right femoral head was  identified using tactile and fluoroscopic technique.  The right groin was anesthetized with 1% subcutaneous Lidocaine.  The right Common Femoral Artery was accessed using the Modified Seldinger Technique with placement of (5 Fr) sheath using the Seldinger technique. The artery was soft and the stick was not back walled.  The sheath was aspirated and flushed.  A 5 Fr JL4 Catheter was advanced of over a Standard J wire into the ascending Aorta.  The catheter was used to engage the left coronary artery.  Multiple cineangiographic views of the left coronary artery system(s) were performed. A 5 Fr JR4 Catheter was advanced of over a Safety J wire into the ascending Aorta.  The catheter was used to engage the right coronary artery.  Multiple cineangiographic views of the right coronary artery system(s) were performed. This catheter was then exchanged over the Standard J wire for an angled Pigtail catheter that was advanced across the Aortic Valve.  LV hemodynamics were measured (and Left Ventriculography was performed).  LV hemodynamics were then re-sampled, and the catheter was pulled back across the Aortic Valve for measurement of "pull-back" gradient.  The catheter and the wire was removed completely out of the body. The patient was transferred to the holding area where the sheath was removed with manual pressure held for hemostasis.   Recovery: The patient was transported to the cath lab holding area in stable condition.   The patient  was stable before, during and following the procedure.   Patient did tolerate procedure well. There were not complications.  EBL: Minimal  Medications:  Premedication: none  Sedation:  1 mg IV Versed, 25 mcg IV Fentanyl  Contrast:  50 ml Omnipaque  Local Anesthesia: 4 cc 1% lidocaine  250 cc Normal saline bolus  Hemodynamics:  Central Aortic Pressure / Mean Aortic Pressure: 111/59  LV Pressure / LV End diastolic Pressure:  11  Left Ventriculography:  EF:   60-65%  Wall Motion: Normal  MR: none             Descending aorta - there is mild incidental narrowing of the descending aorta noted  Coronary Angiographic Data:  Left Main:  Angiographically normal  Left Anterior Descending (LAD):  Angiographically normal, courses around the apex.  1st diagonal (D1):  Smaller vessel without stenosis   Circumflex (LCx):  Large circumflex vessel which is dominant.  Gives off several marginal branches. No angiographic stenosis.  1st obtuse marginal:  No angiographic stenosis  2nd obtuse marginal:  No angiographic stenosis  3rd obtuse marginal:  No angiographic stenosis   posterior lateral branch:  No stenosis   Right Coronary Artery: Nondominant, small vessel with a high bifurcation and parallel branches.  right ventricle branch of right coronary artery: Patent, no stenosis   Impression: 1.  No angiographic stenosis. EF 60-65% with no wall motion abnormalities. 2.  LVEDP = 14 mmHg 3.  Small area of aortic narrowing in the proximal descending aorta  Plan: 1.  Will initiate followup in the office. I would recommend she followup with Dr. Royann Shivers to be evaluated for possible loop recorder. 2.  Her symptoms may also be explained by an intense vagal reaction related to reflux. It may explain her chest pain, nausea and some of her diaphoresis. I do not think we can clearly rule out a arrhythmogenic event antecedent to this episode, but it seems less likely given normal LV function and  normal coronary arteries. 3.  Follow up with me after seeing Dr. Royann Shivers.  The case and results was discussed with the patient and family if available.  The case and results was not discussed with the patient's PCP. The case and results was discussed with the patient's Cardiologist.  Time Spent Directly with the Patient:  45 minutes  Chrystie Nose, MD, [facility] Attending Cardiologist [facility]  HILTY,Kenneth C 07/25/2013, 9:49 AM

## 2013-08-03 ENCOUNTER — Other Ambulatory Visit: Payer: Self-pay | Admitting: *Deleted

## 2013-08-03 DIAGNOSIS — R55 Syncope and collapse: Secondary | ICD-10-CM

## 2013-08-20 ENCOUNTER — Ambulatory Visit (HOSPITAL_COMMUNITY)
Admission: RE | Admit: 2013-08-20 | Discharge: 2013-08-20 | Disposition: A | Discharge status: Home / Self Care | Payer: BC Managed Care – PPO | Source: Ambulatory Visit | Attending: Internal Medicine | Admitting: Internal Medicine

## 2013-08-20 DIAGNOSIS — R55 Syncope and collapse: Secondary | ICD-10-CM | POA: Insufficient documentation

## 2013-08-20 MED ORDER — ALBUTEROL SULFATE (5 MG/ML) 0.5% IN NEBU
2.5000 mg | INHALATION_SOLUTION | Freq: Once | RESPIRATORY_TRACT | Status: AC
  Administered 2013-08-20: 2.5 mg via RESPIRATORY_TRACT

## 2013-09-11 LAB — PULMONARY FUNCTION TEST
DL/VA % pred: 48 %
DL/VA: 2.43 ml/min/mmHg/L
DLCO unc % pred: 44 %
FEF 25-75 Post: 1.18 L/sec
FEF2575-%Change-Post: -3 %
FEF2575-%Pred-Post: 46 %
FEV1-%Change-Post: -2 %
FEV1-%Pred-Post: 83 %
FEV1-%Pred-Pre: 85 %
FEV1-Post: 2.34 L
FEV1-Pre: 2.38 L
FEV1FVC-%Change-Post: 0 %
FEV1FVC-%Pred-Pre: 86 %
FEV6-Post: 3.38 L
FEV6FVC-%Change-Post: -1 %
FEV6FVC-%Pred-Post: 100 %
FVC-%Change-Post: -2 %
FVC-%Pred-Post: 96 %
FVC-%Pred-Pre: 98 %
Post FEV1/FVC ratio: 67 %
Pre FEV1/FVC ratio: 67 %
Pre FEV6/FVC Ratio: 98 %
RV % pred: 95 %
RV: 1.97 L

## 2013-09-21 NOTE — Progress Notes (Signed)
LMTCB on home #.

## 2013-09-25 ENCOUNTER — Encounter: Payer: Self-pay | Admitting: *Deleted

## 2013-09-25 ENCOUNTER — Other Ambulatory Visit: Payer: Self-pay | Admitting: *Deleted

## 2013-09-25 DIAGNOSIS — R942 Abnormal results of pulmonary function studies: Secondary | ICD-10-CM

## 2013-09-25 NOTE — Progress Notes (Signed)
LMTCB

## 2014-08-29 ENCOUNTER — Encounter (HOSPITAL_COMMUNITY): Payer: Self-pay | Admitting: Internal Medicine

## 2017-06-15 ENCOUNTER — Ambulatory Visit (HOSPITAL_COMMUNITY)
Admission: RE | Admit: 2017-06-15 | Discharge: 2017-06-15 | Disposition: A | Discharge status: Home / Self Care | Payer: BC Managed Care – PPO | Source: Ambulatory Visit | Attending: Adult Health Nurse Practitioner | Admitting: Adult Health Nurse Practitioner

## 2017-06-15 ENCOUNTER — Other Ambulatory Visit (HOSPITAL_COMMUNITY): Payer: Self-pay | Admitting: Adult Health Nurse Practitioner

## 2017-06-15 DIAGNOSIS — S93401A Sprain of unspecified ligament of right ankle, initial encounter: Secondary | ICD-10-CM | POA: Insufficient documentation

## 2017-06-15 DIAGNOSIS — S96911A Strain of unspecified muscle and tendon at ankle and foot level, right foot, initial encounter: Secondary | ICD-10-CM | POA: Insufficient documentation

## 2017-06-15 DIAGNOSIS — X58XXXA Exposure to other specified factors, initial encounter: Secondary | ICD-10-CM | POA: Diagnosis not present

## 2019-03-02 ENCOUNTER — Other Ambulatory Visit: Payer: Self-pay | Admitting: Obstetrics & Gynecology

## 2019-03-02 DIAGNOSIS — R1012 Left upper quadrant pain: Secondary | ICD-10-CM

## 2019-03-13 ENCOUNTER — Other Ambulatory Visit: Payer: Self-pay | Admitting: Obstetrics & Gynecology

## 2019-03-15 ENCOUNTER — Ambulatory Visit
Admission: RE | Admit: 2019-03-15 | Discharge: 2019-03-15 | Disposition: A | Discharge status: Home / Self Care | Payer: BC Managed Care – PPO | Source: Ambulatory Visit | Attending: Obstetrics & Gynecology | Admitting: Obstetrics & Gynecology

## 2019-03-15 DIAGNOSIS — R1012 Left upper quadrant pain: Secondary | ICD-10-CM

## 2019-03-15 MED ORDER — IOPAMIDOL (ISOVUE-300) INJECTION 61%
100.0000 mL | Freq: Once | INTRAVENOUS | Status: AC | PRN
  Administered 2019-03-15: 100 mL via INTRAVENOUS

## 2019-08-08 ENCOUNTER — Encounter (INDEPENDENT_AMBULATORY_CARE_PROVIDER_SITE_OTHER): Payer: Self-pay | Admitting: *Deleted

## 2020-06-14 IMAGING — CT CT ABDOMEN AND PELVIS WITH CONTRAST
2 of 5 series · 15 of 46 positions shown, 17 images · IV contrast (iopamidol)
Comparison: CT Abdomen without contrast 03/29/2013

CLINICAL DATA: 63-year-old female with sharp left side pain for
several months, progressive in the past month. Associated
diaphoresis.

Creatinine was obtained on site at [HOSPITAL] at [REDACTED].
Results: Creatinine 0.9 mg/dL.
EXAM:
CT ABDOMEN AND PELVIS WITH CONTRAST
TECHNIQUE: Multidetector CT imaging of the abdomen and pelvis was performed
using the standard protocol following bolus administration of
intravenous contrast.
CONTRAST:  100mL 00S5UN-STT IOPAMIDOL (00S5UN-STT) INJECTION 61%

[Series 2: abd pelvis 5.00 br40 s3 axial · axial · 0.69mm/px · z∈[+1027,+1412]mm · 12 of 87 slices shown, 14 images]
[im 5/87  soft-tissue]
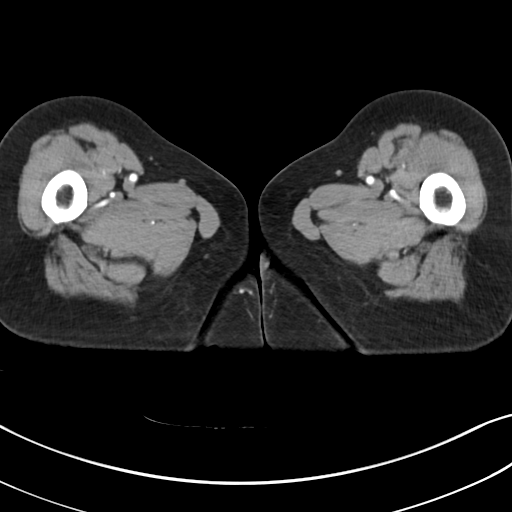
[im 5/87  bone]
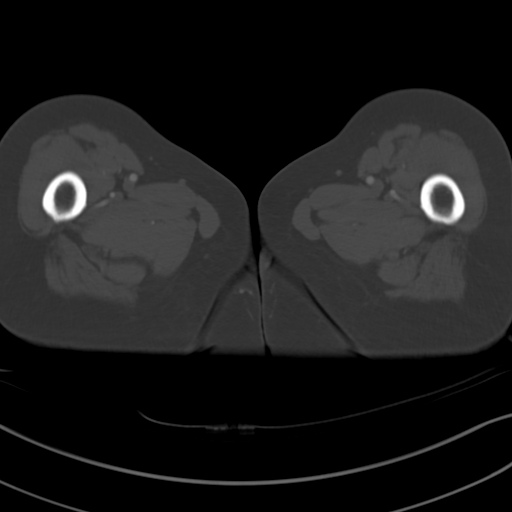
[im 15/87  soft-tissue]
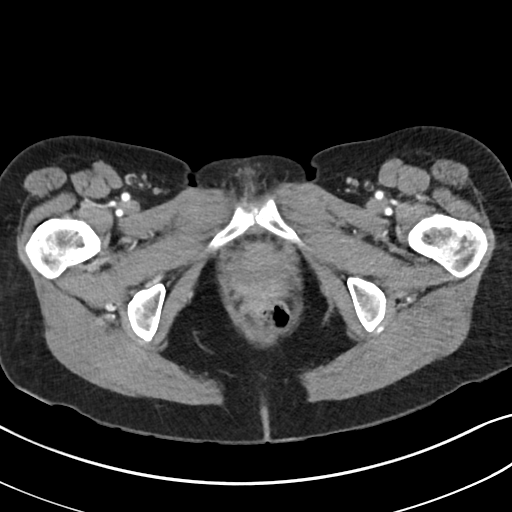
[im 20/87  soft-tissue]
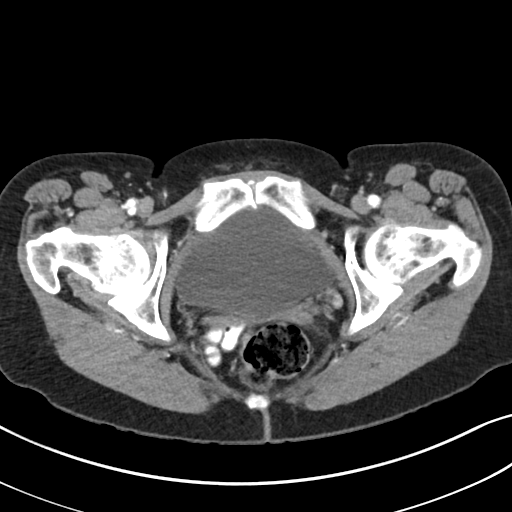
[im 24/87  soft-tissue]
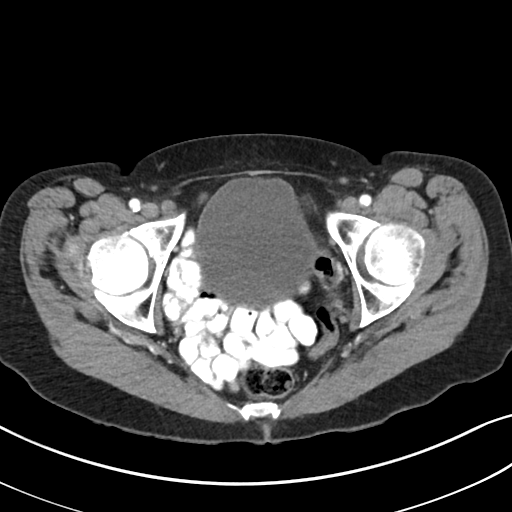
[im 34/87  soft-tissue]
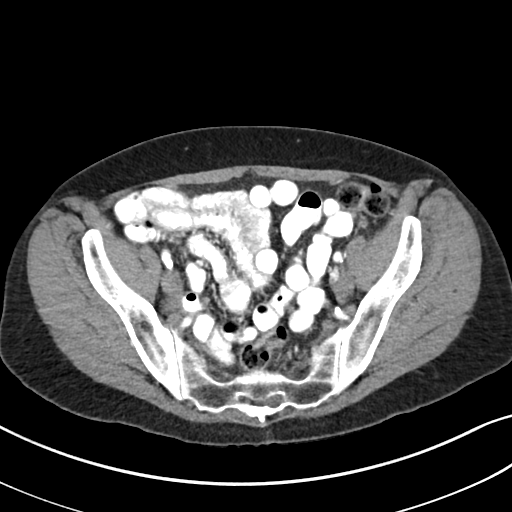
[im 39/87  soft-tissue]
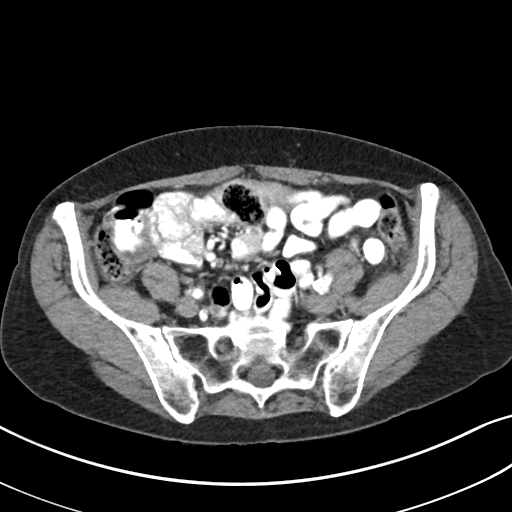
[im 48/87  soft-tissue]
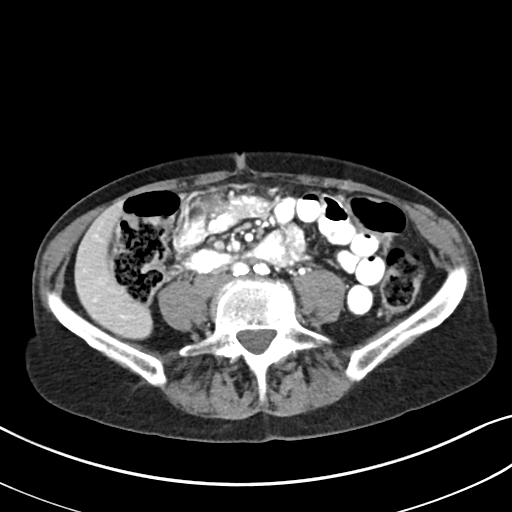
[im 53/87  soft-tissue]
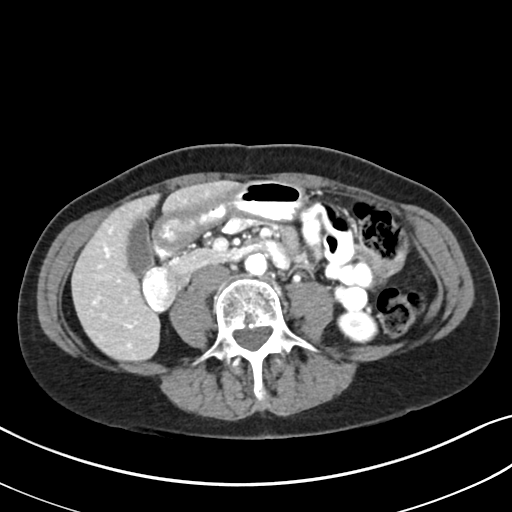
[im 63/87  soft-tissue]
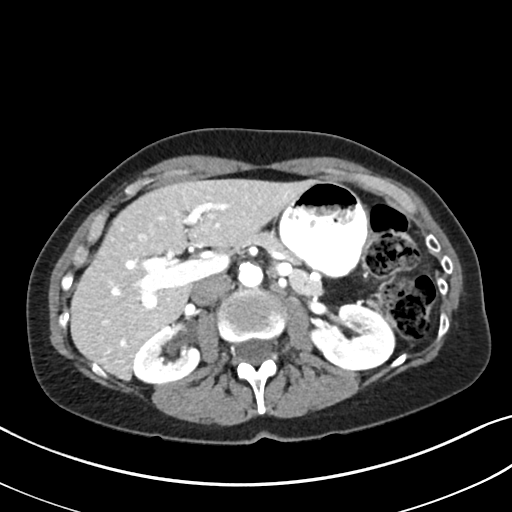
[im 63/87  bone]
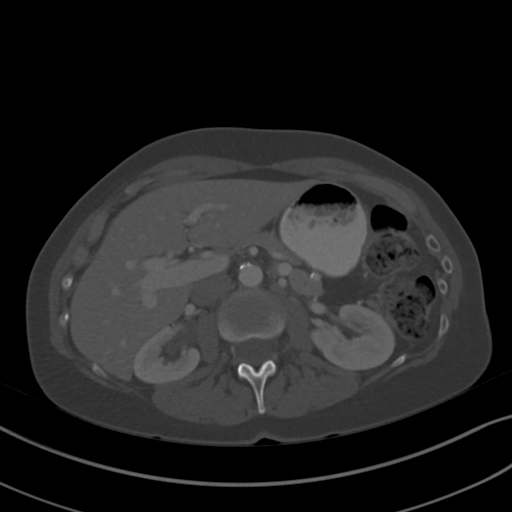
[im 67/87  soft-tissue]
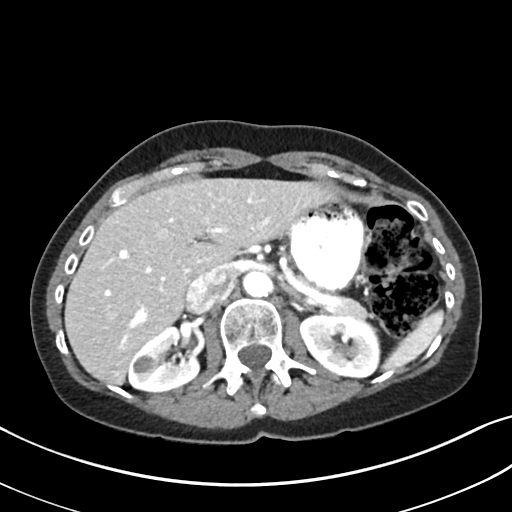
[im 72/87  soft-tissue]
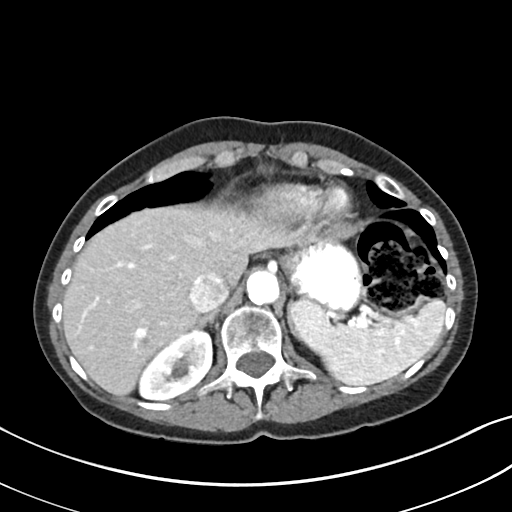
[im 82/87  soft-tissue]
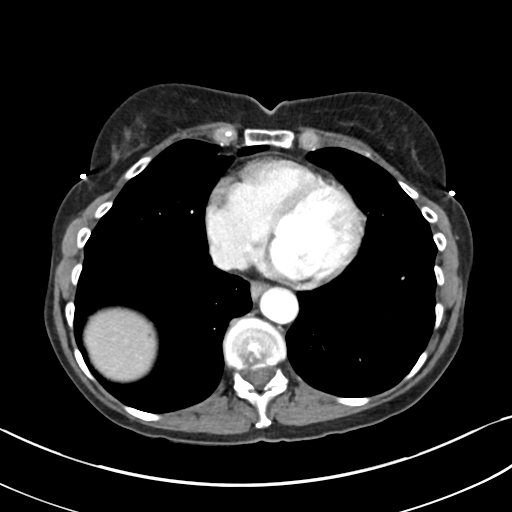

[Series 6: abd pelvis 2.00 br40 s3 cor · coronal · 0.69mm/px · 3 of 129 slices shown]
[im 43/129  soft-tissue]
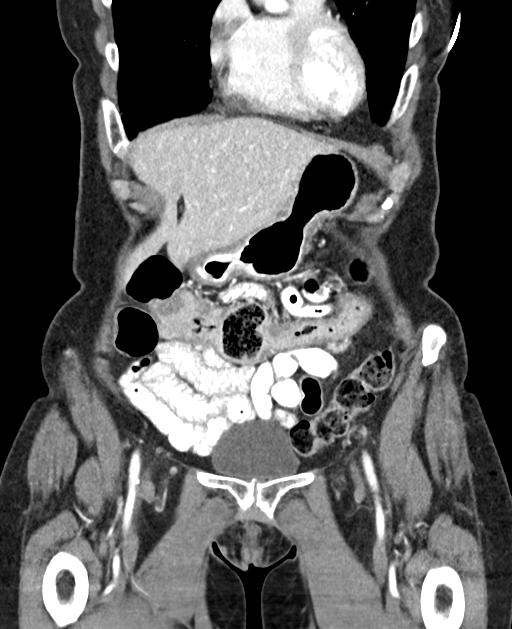
[im 57/129  soft-tissue]
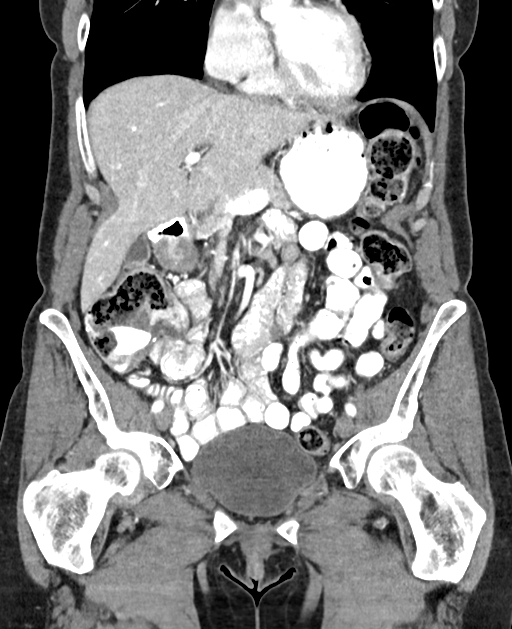
[im 72/129  soft-tissue]
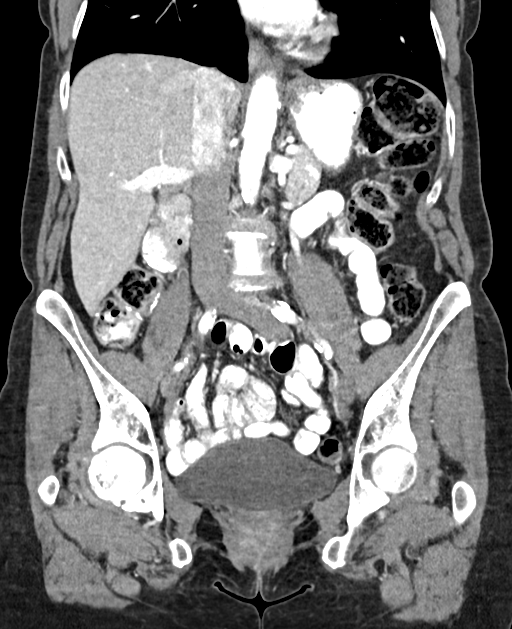

[15 of 46 positions shown; findings below may reference images not displayed]

FINDINGS: Lower chest: Negative lung bases. Ectatic descending thoracic aorta.
No cardiomegaly, pericardial effusion or pleural effusions.

Hepatobiliary: Negative gallbladder. A subcentimeter round
low-density area in the right hepatic lobe on series 2, image 17 is
stable since 2233 and benign (such as a cyst). Otherwise negative
liver. No bile duct enlargement.

Pancreas: Negative.

Spleen: Negative, stable and normal size and configuration.

Adrenals/Urinary Tract: Normal adrenal glands.

The left kidney is normal. There is symmetric renal contrast
excretion on the delayed images. No nephrolithiasis.

At the right renal midpole there is a 19-20 millimeter intermediate
density rounded lesion (48 Hounsfield units). This was not apparent
on the noncontrast comparison. The right kidney otherwise appears
normal. Right extrarenal pelvis is stable. Proximal ureters appear
decompressed. Unremarkable urinary bladder.

Stomach/Bowel: Negative rectosigmoid colon. Negative descending
colon and splenic flexure aside from retained stool. Mildly
redundant transverse colon is mostly decompressed. Oral contrast has
reached the cecum and ascending colon. Sequelae of appendectomy
suspected on series 2,

Stable postoperative changes to the cecum with neo terminal ileum.
No large bowel inflammation. Negative distal small bowel.

Opacified small bowel loops throughout the abdomen and pelvis appear
normal. No mesenteric inflammation. Negative stomach. No free air,
free fluid.

Vascular/Lymphatic: Aortoiliac calcified atherosclerosis. Major
arterial structures are patent. Portal venous system is patent.

No lymphadenopathy.

Reproductive: Surgically absent uterus. Diminutive, normal ovaries.

Other: No pelvic free fluid.

Musculoskeletal: No acute osseous abnormality identified.
IMPRESSION: 1. No inflammatory process in the abdomen or pelvis, no explanation
for left abdominal pain.
2. Indeterminate 2 cm right renal midpole lesion.
Recommend further characterization with renal protocol Abdomen MRI,
or if the patient is not a candidate for MRI renal mass protocol
Abdomen CT (either would be done without and with contrast).
This recommendation follows ACR consensus guidelines: Management of
the Incidental Renal Mass on CT: A White Paper of the ACR Incidental
Findings Committee. [HOSPITAL] 0734;[DATE].
3. Aortic Atherosclerosis (OCVYR-P73.3).

## 2021-07-09 ENCOUNTER — Encounter: Payer: Self-pay | Admitting: *Deleted

## 2021-07-30 ENCOUNTER — Ambulatory Visit: Payer: BC Managed Care – PPO

## 2021-07-30 ENCOUNTER — Ambulatory Visit (INDEPENDENT_AMBULATORY_CARE_PROVIDER_SITE_OTHER): Payer: Self-pay | Admitting: *Deleted

## 2021-07-30 ENCOUNTER — Other Ambulatory Visit: Payer: Self-pay

## 2021-07-30 VITALS — Ht 66.5 in | Wt 130.0 lb

## 2021-07-30 DIAGNOSIS — Z86010 Personal history of colonic polyps: Secondary | ICD-10-CM

## 2021-07-30 NOTE — Progress Notes (Signed)
To clarify, okay to schedule with Dr. Abbey Chatters.

## 2021-07-30 NOTE — Progress Notes (Addendum)
Gastroenterology Pre-Procedure Review  Request Date: 07/30/2021 Requesting Physician: Lars Mage, NP, Last TCS 05/08/2013 done Dr. Britta Mccreedy, anastomosis, no colonic or rectal polyps, Had right colectomy 07/29/2008 by Dr. 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 Diabetes Melitis: no 2. Joint replacements in the past 12 months: no 3. Major health problems in the past 3 months: no 4. Has an artificial valve or MVP: no 5. Has a defibrillator: no 6. Has been advised in past to take antibiotics in advance of a procedure like teeth cleaning: no 7. Family history of colon cancer: yes, father: age 52's  8. Alcohol Use: no 9. Illicit drug Use: yes, marijuana 3 times a week 10. History of sleep apnea: no  11. History of coronary artery or other vascular stents placed within the last 12 months: no 12. History of any prior anesthesia complications: no 13. Body mass index is 20.67 kg/m.    MEDICATIONS & ALLERGIES:    Patient reports the following regarding taking any blood thinners:   Plavix? no Aspirin? no Coumadin? no Brilinta? no Xarelto? no Eliquis? no Pradaxa? no Savaysa? no Effient? no  Patient confirms/reports the following medications:  Current Outpatient Medications  Medication Sig Dispense Refill   ergocalciferol (VITAMIN D2) 1.25 MG (50000 UT) capsule Take 50,000 Units by mouth once a week.     ibuprofen (ADVIL,MOTRIN) 800 MG tablet Take 800 mg by mouth as needed for pain.     omeprazole (PRILOSEC) 20 MG capsule Take 20 mg by mouth as needed.     ondansetron (ZOFRAN) 4 MG tablet Take 4 mg by mouth as needed for nausea.     simvastatin (ZOCOR) 40 MG tablet Take 40 mg by mouth at bedtime.     zolpidem (AMBIEN) 10 MG tablet Take 10 mg by mouth at bedtime as needed for sleep.     No current  facility-administered medications for this visit.    Patient confirms/reports the following allergies:  Allergies  Allergen Reactions   Codeine Itching    No orders of the defined types were placed in this encounter.   [place] INFORMATION Primary Insurance: [place],  ID #: [id number],  Group #: [id number] Pre-Cert / Auth required: Yes, approved online [id number]/[id number] Pre-Cert / Josem Kaufmann #: [id number]  SCHEDULE INFORMATION: Procedure has been scheduled as follows:  Date: 09/18/2021, Time: 12:30 Location: [facility] with Dr. Abbey Chatters  This Gastroenterology Pre-Precedure Review Form is being routed to the following provider(s): Aliene Altes, PA-C

## 2021-07-30 NOTE — Progress Notes (Signed)
OK to schedule. ASA II 

## 2021-07-30 NOTE — Progress Notes (Addendum)
Requested records from [facility] for [employer], PA-C to review.  Report from Dr. Dalbert Batman in [place].

## 2021-07-31 NOTE — Progress Notes (Signed)
Lmom for pt to call me back. 

## 2021-08-03 NOTE — Progress Notes (Signed)
Lmom for pt to call me back. 

## 2021-08-04 ENCOUNTER — Encounter: Payer: Self-pay | Admitting: *Deleted

## 2021-08-04 NOTE — Progress Notes (Signed)
Mailed letter to pt

## 2021-08-07 ENCOUNTER — Encounter: Payer: Self-pay | Admitting: *Deleted

## 2021-08-07 ENCOUNTER — Telehealth: Payer: Self-pay | Admitting: Internal Medicine

## 2021-08-07 MED ORDER — PEG 3350-KCL-NA BICARB-NACL 420 G PO SOLR: 4000.0000 mL | Freq: Once | ORAL | 0 refills | Status: AC

## 2021-08-07 NOTE — Telephone Encounter (Signed)
PATIENT RETURNED CALL  

## 2021-08-07 NOTE — Telephone Encounter (Signed)
Spoke to pt and scheduled procedure for 09/18/2021 at 12:30 with arrival at 11:00.  Pt made aware that I am mailing out prep instructions.  Confirmed mailing address.

## 2021-08-07 NOTE — Addendum Note (Signed)
Addended by: Metro Kung on: 08/07/2021 10:59 AM   Modules accepted: Orders

## 2021-08-07 NOTE — Progress Notes (Signed)
Spoke to pt and scheduled procedure for 09/18/2021 at 12:30 with arrival at 11:00.  Pt made aware that I am mailing out prep instructions.  Confirmed mailing address.

## 2021-08-11 ENCOUNTER — Other Ambulatory Visit: Payer: Self-pay | Admitting: *Deleted

## 2021-09-08 ENCOUNTER — Telehealth: Payer: Self-pay | Admitting: Internal Medicine

## 2021-09-08 MED ORDER — PEG 3350-KCL-NA BICARB-NACL 420 G PO SOLR: 4000.0000 mL | Freq: Once | ORAL | 0 refills | Status: AC

## 2021-09-08 NOTE — Telephone Encounter (Signed)
Spoke to pt.  She informed me that she thinks someone threw her prep kit away.  Requested me to send another RX to [facility] in [place].  Sent accordingly.  Reminded pt to pick up fleet enema and a box of dulcolax.

## 2021-09-08 NOTE — Telephone Encounter (Signed)
Lmom for pt to call me back. 

## 2021-09-08 NOTE — Addendum Note (Signed)
Addended by: Metro Kung on: 09/08/2021 01:06 PM   Modules accepted: Orders

## 2021-09-08 NOTE — Telephone Encounter (Signed)
PATIENT THINKS SOMEONE THREW AWAY HER PREP, CAN YOU PLEASE SEND ANOTHER TO HER PHARMACY

## 2021-09-18 ENCOUNTER — Ambulatory Visit (HOSPITAL_COMMUNITY): Payer: Medicare PPO | Admitting: Anesthesiology

## 2021-09-18 ENCOUNTER — Ambulatory Visit (HOSPITAL_COMMUNITY)
Admission: RE | Admit: 2021-09-18 | Discharge: 2021-09-18 | Disposition: A | Discharge status: Home / Self Care | Payer: Medicare PPO | Attending: Internal Medicine | Admitting: Internal Medicine

## 2021-09-18 ENCOUNTER — Encounter (HOSPITAL_COMMUNITY): Payer: Self-pay

## 2021-09-18 ENCOUNTER — Other Ambulatory Visit: Payer: Self-pay

## 2021-09-18 ENCOUNTER — Encounter (HOSPITAL_COMMUNITY)
Admission: RE | Disposition: A | Discharge status: Home / Self Care | Payer: Self-pay | Source: Home / Self Care | Attending: Internal Medicine

## 2021-09-18 DIAGNOSIS — Z87891 Personal history of nicotine dependence: Secondary | ICD-10-CM | POA: Insufficient documentation

## 2021-09-18 DIAGNOSIS — Z980 Intestinal bypass and anastomosis status: Secondary | ICD-10-CM | POA: Diagnosis not present

## 2021-09-18 DIAGNOSIS — Z86010 Personal history of colonic polyps: Secondary | ICD-10-CM | POA: Diagnosis not present

## 2021-09-18 DIAGNOSIS — Z9049 Acquired absence of other specified parts of digestive tract: Secondary | ICD-10-CM | POA: Diagnosis not present

## 2021-09-18 DIAGNOSIS — Z1211 Encounter for screening for malignant neoplasm of colon: Secondary | ICD-10-CM | POA: Diagnosis not present

## 2021-09-18 DIAGNOSIS — D123 Benign neoplasm of transverse colon: Secondary | ICD-10-CM | POA: Diagnosis not present

## 2021-09-18 HISTORY — PX: COLONOSCOPY WITH PROPOFOL: SHX5780

## 2021-09-18 HISTORY — PX: POLYPECTOMY: SHX5525

## 2021-09-18 SURGERY — COLONOSCOPY WITH PROPOFOL
Anesthesia: General

## 2021-09-18 MED ORDER — PROPOFOL 500 MG/50ML IV EMUL
INTRAVENOUS | Status: DC | PRN
  Administered 2021-09-18: 150 ug/kg/min via INTRAVENOUS

## 2021-09-18 MED ORDER — LACTATED RINGERS IV SOLN: INTRAVENOUS | Status: DC

## 2021-09-18 MED ORDER — PROPOFOL 10 MG/ML IV BOLUS
INTRAVENOUS | Status: DC | PRN
  Administered 2021-09-18: 50 mg via INTRAVENOUS

## 2021-09-18 NOTE — Discharge Instructions (Addendum)
°  Colonoscopy Discharge Instructions  Read the instructions outlined below and refer to this sheet in the next few weeks. These discharge instructions provide you with general information on caring for yourself after you leave the hospital. Your doctor may also give you specific instructions. While your treatment has been planned according to the most current medical practices available, unavoidable complications occasionally occur.   ACTIVITY You may resume your regular activity, but move at a slower pace for the next 24 hours.  Take frequent rest periods for the next 24 hours.  Walking will help get rid of the air and reduce the bloated feeling in your belly (abdomen).  No driving for 24 hours (because of the medicine (anesthesia) used during the test).   Do not sign any important legal documents or operate any machinery for 24 hours (because of the anesthesia used during the test).  NUTRITION Drink plenty of fluids.  You may resume your normal diet as instructed by your doctor.  Begin with a light meal and progress to your normal diet. Heavy or fried foods are harder to digest and may make you feel sick to your stomach (nauseated).  Avoid alcoholic beverages for 24 hours or as instructed.  MEDICATIONS You may resume your normal medications unless your doctor tells you otherwise.  WHAT YOU CAN EXPECT TODAY Some feelings of bloating in the abdomen.  Passage of more gas than usual.  Spotting of blood in your stool or on the toilet paper.  IF YOU HAD POLYPS REMOVED DURING THE COLONOSCOPY: No aspirin products for 7 days or as instructed.  No alcohol for 7 days or as instructed.  Eat a soft diet for the next 24 hours.  FINDING OUT THE RESULTS OF YOUR TEST Not all test results are available during your visit. If your test results are not back during the visit, make an appointment with your caregiver to find out the results. Do not assume everything is normal if you have not heard from your  caregiver or the medical facility. It is important for you to follow up on all of your test results.  SEEK IMMEDIATE MEDICAL ATTENTION IF: You have more than a spotting of blood in your stool.  Your belly is swollen (abdominal distention).  You are nauseated or vomiting.  You have a temperature over 101.  You have abdominal pain or discomfort that is severe or gets worse throughout the day.   Your colonoscopy revealed 1 polyp(s) which I removed successfully. Await pathology results, my office will contact you. I recommend repeating colonoscopy in 5 years for surveillance purposes. Previous surgical site appeared healthy. Follow up with GI as needed.    I hope you have a great rest of your week!  Elon Alas. Abbey Chatters, D.O. Gastroenterology and Hepatology [facility]

## 2021-09-18 NOTE — Transfer of Care (Signed)
Immediate Anesthesia Transfer of Care Note  Patient: Kristy Myers  Procedure(s) Performed: COLONOSCOPY WITH PROPOFOL POLYPECTOMY  Patient Location: PACU  Anesthesia Type:MAC  Level of Consciousness: awake, alert  and oriented  Airway & Oxygen Therapy: Patient Spontanous Breathing and Patient connected to nasal cannula oxygen  Post-op Assessment: Report given to RN and Post -op Vital signs reviewed and stable  Post vital signs: Reviewed and stable  Last Vitals:  Vitals Value Taken Time  BP    Temp    Pulse    Resp    SpO2      Last Pain:  Vitals:   09/18/21 1145  TempSrc:   PainSc: 0-No pain      Patients Stated Pain Goal: 7 (72/62/03 5597)  Complications: No notable events documented.

## 2021-09-18 NOTE — Anesthesia Preprocedure Evaluation (Signed)
Anesthesia Evaluation  Patient identified by MRN, date of birth, ID band Patient awake    Reviewed: Allergy & Precautions, H&P , NPO status , Patient's Chart, lab work & pertinent test results, reviewed documented beta blocker date and time   Airway Mallampati: II  TM Distance: >3 FB Neck ROM: full    Dental no notable dental hx.    Pulmonary neg pulmonary ROS, former smoker,    Pulmonary exam normal breath sounds clear to auscultation       Cardiovascular Exercise Tolerance: Good negative cardio ROS   Rhythm:regular Rate:Normal     Neuro/Psych negative neurological ROS  negative psych ROS   GI/Hepatic negative GI ROS, Neg liver ROS,   Endo/Other  negative endocrine ROS  Renal/GU negative Renal ROS  negative genitourinary   Musculoskeletal   Abdominal   Peds  Hematology negative hematology ROS (+)   Anesthesia Other Findings   Reproductive/Obstetrics negative OB ROS                             Anesthesia Physical Anesthesia Plan  ASA: 2  Anesthesia Plan: General   Post-op Pain Management:    Induction:   PONV Risk Score and Plan: Propofol infusion  Airway Management Planned:   Additional Equipment:   Intra-op Plan:   Post-operative Plan:   Informed Consent: I have reviewed the patients History and Physical, chart, labs and discussed the procedure including the risks, benefits and alternatives for the proposed anesthesia with the patient or authorized representative who has indicated his/her understanding and acceptance.     Dental Advisory Given  Plan Discussed with: CRNA  Anesthesia Plan Comments:         Anesthesia Quick Evaluation  

## 2021-09-18 NOTE — Anesthesia Postprocedure Evaluation (Signed)
Anesthesia Post Note  Patient: Kristy Myers  Procedure(s) Performed: COLONOSCOPY WITH PROPOFOL POLYPECTOMY  Patient location during evaluation: Phase II Anesthesia Type: General Level of consciousness: awake Pain management: pain level controlled Vital Signs Assessment: post-procedure vital signs reviewed and stable Respiratory status: spontaneous breathing and respiratory function stable Cardiovascular status: blood pressure returned to baseline and stable Postop Assessment: no headache and no apparent nausea or vomiting Anesthetic complications: no Comments: Late entry   No notable events documented.   Last Vitals:  Vitals:   09/18/21 1109 09/18/21 1208  BP: (!) 143/83 (!) 106/46  Pulse:  63  Resp: 11 17  Temp: 36.6 C (!) 36.4 C  SpO2: 98% 97%    Last Pain:  Vitals:   09/18/21 1208  TempSrc: Axillary  PainSc: 0-No pain                 Louann Sjogren

## 2021-09-18 NOTE — H&P (Signed)
Primary Care Physician:  Bridget Hartshorn, NP Primary Gastroenterologist:  Dr. Abbey Chatters  Pre-Procedure History & Physical: HPI:  Kristy Myers is a 65 y.o. female is here for a colonoscopy to be performed for surveillance purposes. Last TCS 05/08/2013 done Dr. Britta Mccreedy, anastomosis, no colonic or rectal polyps, Had right colectomy 07/29/2008 by Dr. 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  Past Medical History:  Diagnosis Date   History of nuclear stress test 06/11/2011   lexiscan; normal pattern of perfusion, low risk scan    Syncope     Past Surgical History:  Procedure Laterality Date   APPENDECTOMY     COLON SURGERY  06/2009   LEFT HEART CATHETERIZATION WITH CORONARY ANGIOGRAM N/A 07/25/2013   Procedure: LEFT HEART CATHETERIZATION WITH CORONARY ANGIOGRAM;  Surgeon: Pixie Casino, MD;  Location: [facility] CATH LAB;  Service: Cardiovascular;  Laterality: N/A;   PARTIAL HYSTERECTOMY      Prior to Admission medications   Medication Sig Start Date End Date Taking? Authorizing Provider  ergocalciferol (VITAMIN D2) 1.25 MG (50000 UT) capsule Take 50,000 Units by mouth once a week.   Yes [provider]  ibuprofen (ADVIL,MOTRIN) 800 MG tablet Take 800 mg by mouth every 8 (eight) hours as needed for moderate pain or headache.   Yes [provider]  LINZESS 145 MCG CAPS capsule Take 145 mcg by mouth daily as needed for constipation. 08/19/21  Yes [provider]  Multiple Vitamins-Minerals (MULTIVITAMIN GUMMIES ADULTS PO) Take 4 tablets by mouth daily.   Yes [provider]  omeprazole (PRILOSEC) 20 MG capsule Take 20 mg by mouth daily as needed (heartburn). 06/27/13  Yes [provider]  zolpidem (AMBIEN) 10 MG tablet Take 10 mg by mouth at bedtime as needed for sleep.   Yes [provider]  promethazine (PHENERGAN) 25 MG tablet Take 25 mg by mouth every 8 (eight) hours as needed for  nausea/vomiting. 08/02/21   [provider]  rizatriptan (MAXALT) 10 MG tablet Take 10 mg by mouth as needed for migraine. 08/30/21   [provider]  simvastatin (ZOCOR) 40 MG tablet Take 40 mg by mouth at bedtime.    [provider]    Allergies as of 08/07/2021 - Review Complete 07/30/2021  Allergen Reaction Noted   Codeine Itching 07/13/2013    Family History  Problem Relation Age of Onset   Cancer Father    Heart Problems Maternal Grandmother    Other Mother        sepsis    Social History   Socioeconomic History   Marital status: Married    Spouse name: Not on file   Number of children: 3   Years of education: Not on file   Highest education level: Not on file  Occupational History   Occupation: [employer]    Employer: [employer]  Tobacco Use   Smoking status: Former    Packs/day: 1.00    Years: 33.00    Pack years: 33.00    Types: Cigarettes    Quit date: 07/31/2021    Years since quitting: 0.1   Smokeless tobacco: Never  Vaping Use   Vaping Use: Never used  Substance and Sexual Activity   Alcohol use: No   Drug use: No   Sexual activity: Not on file  Other Topics Concern   Not on file  Social History Narrative   Not on file   Social Determinants  of Health   Financial Resource Strain: Not on file  Food Insecurity: Not on file  Transportation Needs: Not on file  Physical Activity: Not on file  Stress: Not on file  Social Connections: Not on file  Intimate Partner Violence: Not on file    Review of Systems: See HPI, otherwise negative ROS  Physical Exam: Vital signs in last 24 hours: Temp:  [97.8 F (36.6 C)] 97.8 F (36.6 C) (12/30 1109) Resp:  [11] 11 (12/30 1109) BP: (143)/(83) 143/83 (12/30 1109) SpO2:  [98 %] 98 % (12/30 1109) Weight:  [59 kg] 59 kg (12/30 1109)   General:   Alert,  Well-developed, well-nourished, pleasant and cooperative in NAD Head:  Normocephalic and  atraumatic. Eyes:  Sclera clear, no icterus.   Conjunctiva pink. Ears:  Normal auditory acuity. Nose:  No deformity, discharge,  or lesions. Mouth:  No deformity or lesions, dentition normal. Neck:  Supple; no masses or thyromegaly. Lungs:  Clear throughout to auscultation.   No wheezes, crackles, or rhonchi. No acute distress. Heart:  Regular rate and rhythm; no murmurs, clicks, rubs,  or gallops. Abdomen:  Soft, nontender and nondistended. No masses, hepatosplenomegaly or hernias noted. Normal bowel sounds, without guarding, and without rebound.   Msk:  Symmetrical without gross deformities. Normal posture. Extremities:  Without clubbing or edema. Neurologic:  Alert and  oriented x4;  grossly normal neurologically. Skin:  Intact without significant lesions or rashes. Cervical Nodes:  No significant cervical adenopathy. Psych:  Alert and cooperative. Normal mood and affect.  Impression/Plan: TIA [name] is here for a colonoscopy to be performed for surveillance purposes. Last TCS 05/08/2013 done Dr. Britta Mccreedy, anastomosis, no colonic or rectal polyps, Had right colectomy 07/29/2008 by Dr. 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 Questions have been answered. All parties agreeable.

## 2021-09-18 NOTE — Op Note (Signed)
[facility] Patient Name: Kristy Myers Procedure Date: 09/18/2021 11:35 AM MRN: [id number] Date of Birth: March 22, 1956 Attending MD: Elon Alas. Abbey Chatters DO CSN: [id number] Age: 65 Admit Type: Outpatient Procedure:                Colonoscopy Indications:              High risk colon cancer surveillance: Personal                            history of adenoma with villous component Providers:                Elon Alas. Abbey Chatters, DO, Hughie Closs, RN, Rosina Lowenstein, RN, Raphael Gibney, Technician Referring MD:              Medicines:                See the Anesthesia note for documentation of the                            administered medications Complications:            No immediate complications. Estimated Blood Loss:     Estimated blood loss was minimal. Procedure:                Pre-Anesthesia Assessment:                           - The anesthesia plan was to use monitored                            anesthesia care (MAC).                           After obtaining informed consent, the colonoscope                            was passed under direct vision. Throughout the                            procedure, the patient's blood pressure, pulse, and                            oxygen saturations were monitored continuously. The                            [id number]-HQ190L ([id number]) scope was introduced through                            the anus and advanced to the the ileocolonic                            anastomosis. The colonoscopy was performed without                            difficulty. The patient tolerated  the procedure                            well. The quality of the bowel preparation was                            evaluated using the BBPS [facility] Bowel Preparation                            Scale) with scores of: Right Colon = 2 (minor                            amount of residual staining, small fragments of                            stool and/or  opaque liquid, but mucosa seen well),                            Transverse Colon = 2 (minor amount of residual                            staining, small fragments of stool and/or opaque                            liquid, but mucosa seen well) and Left Colon = 2                            (minor amount of residual staining, small fragments                            of stool and/or opaque liquid, but mucosa seen                            well). The total BBPS score equals 6. The quality                            of the bowel preparation was fair. Scope In: 11:44:43 AM Scope Out: 12:03:13 PM Scope Withdrawal Time: 0 hours 13 minutes 39 seconds  Total Procedure Duration: 0 hours 18 minutes 30 seconds  Findings:      The perianal and digital rectal examinations were normal.      A 8 mm polyp was found in the transverse colon. The polyp was flat. The       polyp was removed with a cold snare. Resection and retrieval were       complete.      There was evidence of a prior side-to-side ileo-colonic anastomosis in       the transverse colon due to previous R hemicolectomy. This was patent       and was characterized by healthy appearing mucosa. Impression:               - Preparation of the colon was fair.                           -  One 8 mm polyp in the transverse colon, removed                            with a cold snare. Resected and retrieved.                           - Patent side-to-side ileo-colonic anastomosis,                            characterized by healthy appearing mucosa. Moderate Sedation:      Per Anesthesia Care Recommendation:           - Patient has a contact number available for                            emergencies. The signs and symptoms of potential                            delayed complications were discussed with the                            patient. Return to normal activities tomorrow.                            Written discharge instructions were  provided to the                            patient.                           - Resume previous diet.                           - Continue present medications.                           - Await pathology results.                           - Repeat colonoscopy in 5 years for surveillance.                           - Return to GI clinic PRN. Procedure Code(s):        --- Professional ---                           [phone], Colonoscopy, flexible; with removal of                            tumor(s), polyp(s), or other lesion(s) by snare                            technique Diagnosis Code(s):        --- Professional ---                           Z86.010, Personal  history of colonic polyps                           K63.5, Polyp of colon                           Z98.0, Intestinal bypass and anastomosis status CPT copyright 2019 American Medical Association. All rights reserved. The codes documented in this report are preliminary and upon coder review may  be revised to meet current compliance requirements. Elon Alas. Abbey Chatters, DO [employer] Abbey Chatters, DO 09/18/2021 12:07:49 PM This report has been signed electronically. Number of Addenda: 0

## 2021-09-22 LAB — SURGICAL PATHOLOGY

## 2021-09-24 ENCOUNTER — Encounter (HOSPITAL_COMMUNITY): Payer: Self-pay | Admitting: Internal Medicine

## 2021-10-19 NOTE — Progress Notes (Signed)
Pt returned called and was advised of her result note

## 2022-08-26 ENCOUNTER — Other Ambulatory Visit (HOSPITAL_COMMUNITY): Payer: Self-pay | Admitting: Adult Health Nurse Practitioner

## 2022-08-26 DIAGNOSIS — N951 Menopausal and female climacteric states: Secondary | ICD-10-CM

## 2022-09-03 ENCOUNTER — Ambulatory Visit (HOSPITAL_COMMUNITY)
Admission: RE | Admit: 2022-09-03 | Discharge: 2022-09-03 | Disposition: A | Discharge status: Home / Self Care | Payer: Medicare PPO | Source: Ambulatory Visit | Attending: Adult Health Nurse Practitioner | Admitting: Adult Health Nurse Practitioner

## 2022-09-03 DIAGNOSIS — Z13820 Encounter for screening for osteoporosis: Secondary | ICD-10-CM | POA: Insufficient documentation

## 2022-09-03 DIAGNOSIS — Z780 Asymptomatic menopausal state: Secondary | ICD-10-CM | POA: Diagnosis not present

## 2022-09-03 DIAGNOSIS — M810 Age-related osteoporosis without current pathological fracture: Secondary | ICD-10-CM | POA: Insufficient documentation

## 2022-09-03 DIAGNOSIS — F17200 Nicotine dependence, unspecified, uncomplicated: Secondary | ICD-10-CM | POA: Diagnosis not present

## 2022-09-03 DIAGNOSIS — N951 Menopausal and female climacteric states: Secondary | ICD-10-CM

## 2024-03-02 ENCOUNTER — Emergency Department (HOSPITAL_COMMUNITY)
Admission: EM | Admit: 2024-03-02 | Discharge: 2024-03-02 | Disposition: A | Discharge status: Home / Self Care | Attending: Emergency Medicine | Admitting: Emergency Medicine

## 2024-03-02 ENCOUNTER — Other Ambulatory Visit: Payer: Self-pay

## 2024-03-02 ENCOUNTER — Encounter (HOSPITAL_COMMUNITY): Payer: Self-pay

## 2024-03-02 DIAGNOSIS — R0602 Shortness of breath: Secondary | ICD-10-CM | POA: Diagnosis present

## 2024-03-02 DIAGNOSIS — T782XXA Anaphylactic shock, unspecified, initial encounter: Secondary | ICD-10-CM | POA: Insufficient documentation

## 2024-03-02 MED ORDER — EPINEPHRINE 0.3 MG/0.3ML IJ SOAJ: 0.3000 mg | INTRAMUSCULAR | 0 refills | Status: AC | PRN

## 2024-03-02 MED ORDER — PREDNISONE 50 MG PO TABS: ORAL_TABLET | ORAL | 0 refills | Status: DC

## 2024-03-02 MED ORDER — PREDNISONE 50 MG PO TABS
60.0000 mg | ORAL_TABLET | Freq: Once | ORAL | Status: AC
  Administered 2024-03-02: 60 mg via ORAL
  Filled 2024-03-02: qty 1

## 2024-03-02 MED ORDER — ALBUTEROL SULFATE (2.5 MG/3ML) 0.083% IN NEBU
5.0000 mg/h | INHALATION_SOLUTION | Freq: Once | RESPIRATORY_TRACT | Status: AC
  Administered 2024-03-02: 5 mg/h via RESPIRATORY_TRACT
  Filled 2024-03-02: qty 6

## 2024-03-02 NOTE — ED Triage Notes (Signed)
 Rcems from home. Cc of allergic reaction. Says an ant was crawling on neck and bit her. Soon after she got hives all over, itching, SOB (80% on room air) and swelling of lips.  Ems gave her 50mg  benadryl and 1mg  of 1:1 Epi IV. 18g L AC. Says she feels better now 89% on room air on arrival.

## 2024-03-02 NOTE — ED Provider Notes (Signed)
 [place] EMERGENCY DEPARTMENT AT [facility] Provider Note   CSN: [id number] Arrival date & time: 03/02/24  0017     Patient presents with: Allergic Reaction   Kristy Myers is a 68 y.o. female.   The history is provided by the patient.  Patient presents for severe allergic reaction Patient was watching her granddaughter swim when she noticed a sting on her neck, and she noticed an ant had stung her.  Soon after she started developing shortness of breath, rash throughout her body and lip swelling.  She took an EpiPen with minimal response.  EMS was called, and she was given an additional dose of epinephrine, Benadryl, and placed on oxygen.  She also reports an episode of vomiting.  No syncope.  Patient reports she has had severe reactions to bee stings, but never an ant  She is now feeling back to baseline She has not been seen by an allergist here locally     Prior to Admission medications   Medication Sig Start Date End Date Taking? Authorizing Provider  EPINEPHrine 0.3 mg/0.3 mL IJ SOAJ injection Inject 0.3 mg into the muscle as needed for anaphylaxis. 03/02/24  Yes Eldon Greenland, MD  predniSONE (DELTASONE) 50 MG tablet 1 tablet PO QD X4 days 03/02/24  Yes Eldon Greenland, MD  ergocalciferol (VITAMIN D2) 1.25 MG (50000 UT) capsule Take 50,000 Units by mouth once a week.    [provider]  ibuprofen (ADVIL,MOTRIN) 800 MG tablet Take 800 mg by mouth every 8 (eight) hours as needed for moderate pain or headache.    [provider]  LINZESS 145 MCG CAPS capsule Take 145 mcg by mouth daily as needed for constipation. 08/19/21   [provider]  Multiple Vitamins-Minerals (MULTIVITAMIN GUMMIES ADULTS PO) Take 4 tablets by mouth daily.    [provider]  omeprazole (PRILOSEC) 20 MG capsule Take 20 mg by mouth daily as needed (heartburn). 06/27/13   [provider]  promethazine (PHENERGAN) 25 MG tablet Take 25 mg by mouth  every 8 (eight) hours as needed for nausea/vomiting. 08/02/21   [provider]  rizatriptan (MAXALT) 10 MG tablet Take 10 mg by mouth as needed for migraine. 08/30/21   [provider]  simvastatin (ZOCOR) 40 MG tablet Take 40 mg by mouth at bedtime.    [provider]  zolpidem (AMBIEN) 10 MG tablet Take 10 mg by mouth at bedtime as needed for sleep.    [provider]    Allergies: Bee venom, Fire ant, and Codeine    Review of Systems  Constitutional:  Negative for fever.  Respiratory:  Positive for shortness of breath.   Gastrointestinal:  Positive for vomiting.  Skin:  Positive for rash.    Updated Vital Signs BP 98/64   Pulse 69   Temp 98.1 F (36.7 C)   Resp 19   Ht 1.676 m (5' 6)   Wt 54.4 kg   SpO2 96%   BMI 19.37 kg/m   Physical Exam CONSTITUTIONAL: Well developed/well nourished HEAD: Normocephalic/atraumatic EYES: EOMI/PERRL ENMT: Mucous membranes moist, minimal upper lip swelling, no tongue edema, uvula midline without edema, no stridor or drooling NECK: supple no meningeal signs CV: S1/S2 noted, no murmurs/rubs/gallops noted LUNGS: Scattered wheeze bilaterally, no acute distress NEURO: Pt is awake/alert/appropriate, moves all extremitiesx4.  No facial droop.   EXTREMITIES: pulses normal/equal, full ROM SKIN: warm, color normal no rash noted PSYCH: no abnormalities of mood noted, alert and oriented to situation  (  all labs ordered are listed, but only abnormal results are displayed) Labs Reviewed  TRYPTASE    EKG: None  Radiology: No results found.   Procedures   Medications Ordered in the ED  predniSONE (DELTASONE) tablet 60 mg (60 mg Oral Given 03/02/24 0141)  albuterol  (PROVENTIL ) (2.5 MG/3ML) 0.083% nebulizer solution (5 mg/hr Nebulization Given 03/02/24 0157)    Clinical Course as of 03/02/24 0225  Fri Mar 02, 2024  0224 Patient improved, no acute distress and requesting discharge home.  She declines to be  further monitored.  She already has an EpiPen at home, and I have sent in another prescription for 1.  She has been given referral to allergy.  We discussed strict return precautions and when to use the EpiPen and call [phone] [DW]    Clinical Course User Index [DW] Eldon Greenland, MD                                 Medical Decision Making Amount and/or Complexity of Data Reviewed Labs: ordered.  Risk Prescription drug management.        Final diagnoses:  Anaphylaxis, initial encounter    ED Discharge Orders          Ordered    Ambulatory referral to Allergy        03/02/24 0134    EPINEPHrine 0.3 mg/0.3 mL IJ SOAJ injection  As needed        03/02/24 0135    predniSONE (DELTASONE) 50 MG tablet        03/02/24 0135               Eldon Greenland, MD 03/02/24 0225

## 2024-03-05 NOTE — Progress Notes (Signed)
  Medicare AWV  Kristy Myers is a 68 y.o. female who presents for her subsequent annual wellness visit for Medicare.  Clinical documentation was reviewed and is accessible via encounter-level attachments.    Any physical exam components or additional concerns beyond the scope of the Annual Wellness Visit may be documented in a separate note within this encounter.  Medicare Required Components     Reviewed and updated this visit by provider: Tobacco  Allergies  Meds  Problems  Med Hx  Surg Hx  Fam Hx       Medicare required assessment: Future risk of substance use disorder / overdose risk of 46% is higher (>=20%).    Patient Care Team: Comer Baird [name], NP as PCP - General (Nurse Practitioner)  Vitals   Vitals:   03/05/24 1421  BP: 150/82  Patient Position: Sitting  Pulse: 63  Temp: 97.5 F (36.4 C)  TempSrc: Temporal  Resp: 18  Height: 5' 5 (1.651 m)  Weight: 118 lb (53.5 kg)  SpO2: 95%  BMI (Calculated): 19.6    Disposition   1. Medicare annual wellness visit, subsequent (Primary) 2. Bee sting allergy 3. Dyslipidemia -     CBC And Differential; Future -     Comprehensive Metabolic Panel; Future; Expected date: 03/12/2024 -     Lipid Panel With LDL/HDL Ratio; Future; Expected date: 03/12/2024 4. Vitamin D deficiency -     Vitamin D 25 Hydroxy; Future; Expected date: 03/12/2024 5. Gastroesophageal reflux disease without esophagitis 6. Post menopausal syndrome 7. Pernicious anemia -     Vitamin B12; Future; Expected date: 03/12/2024 8. Cigarette nicotine dependence without complication 9. Age-related osteoporosis without current pathological fracture 10. Intractable migraine without aura and without status migrainosus 11. Chronic constipation Other orders -     ergocalciferol (VITAMIN D2) 50,000 units CAPS capsule; TAKE 1 CAPSULE BY MOUTH ONE TIME PER WEEK, Normal   Follow up in about 6 months (around 09/04/2024).   Health maintenance issues were  discussed with the patient.  A written plan was provided to the patient in the form of patient instructions in the After Visit Summary document.

## 2024-03-05 NOTE — Progress Notes (Signed)
 Impression   1. Medicare annual wellness visit, subsequent      2. Bee sting allergy      3. Dyslipidemia  CBC And Differential   Comprehensive Metabolic Panel   Lipid Panel With LDL/HDL Ratio   CBC And Differential   Lipid Panel With LDL/HDL Ratio   Comprehensive Metabolic Panel    4. Vitamin D deficiency  Vitamin D 25 Hydroxy   Vitamin D 25 Hydroxy    5. Gastroesophageal reflux disease without esophagitis      6. Post menopausal syndrome      7. Pernicious anemia  Vitamin B12   Vitamin B12    8. Cigarette nicotine dependence without complication      9. Age-related osteoporosis without current pathological fracture      10. Intractable migraine without aura and without status migrainosus      11. Chronic constipation      12. Elevated blood-pressure reading without diagnosis of hypertension        Assessment & Plan  Plan   Pernicious anemia she continues to take B12 injections about every 30 days but her last B12 level was greater than 2000 we will consider bumping down to 1 injection every 2 months.  Check her blood work today and go from there she agrees with this plan   GERD stable omeprazole and Phenergan as needed she takes this rarely but takes omeprazole every day  Hyperlipidemia check CMP lipid continue simvastatin  40 mg nightly encourage monitoring diet regular cardio exercise  Bee sting allergy just via EpiPen  prescription that was sent by the ER.  She recently was treated for anaphylaxis related to bee sting in the ER reviewed that visit March 02, 2024 still finishing up with oral steroid  Elevated blood-pressure reading today without diagnosis of hypertension advised to continue to monitor from home likely could be related to recent treatment with epinephrine  as well as steroids discussed goal blood pressure reading she will let me know  Migraine stable with Maxalt as needed  Insomnia stable with Ambien  10 mg at bedtime  Osteoporosis stable bone  density is up-to-date continue with alendronate calcium vitamin D  vitamin D deficiency check level back in December was too high may need to cut back on the dosage of 50,000 or national units to every 2 weeks  Chronic constipation stable with Linzess   Cigarette nicotine dependence encourage patient to quit patient not motivated at this time  Spent 45 minutes with the patient with greater than 50% time spent in counseling coronation of care chronic disease state management along with acute follow-up for bee sting ED follow-up visit and left our elevated blood pressure readings from treatment for that  Health Maintenance Due  Topic Date Due  . Pneumococcal Vaccine: 50+ Years (1 of 2 - PCV) Never done  . RSV Adult and Pregnancy (1 - Risk 60-74 years 1-dose series) Never done  . COVID-19 Vaccine (3 - 2024-25 season) 05/22/2023    See encounter after visit summary for patient specific instructions.  Follow up in about 6 months (around 09/04/2024).   Orders Placed This Encounter  Procedures  . CBC And Differential  . Comprehensive Metabolic Panel  . Lipid Panel With LDL/HDL Ratio  . Vitamin D 25 Hydroxy  . Vitamin B12     Patient's Medications       * Accurate as of March 05, 2024  3:39 PM. Reflects encounter med changes as of last refresh  Continued Medications      Instructions  alendronate 70 mg tablet Commonly known as: FOSAMAX  70 mg, Oral, Every 7 days   CYANOCOBALAMIN IJ  Inject as directed.   EPINEPHrine  0.3 mg/0.3 mL injection Commonly known as: EPIPEN  2-PAK  Use device as directed for severe allergic reaction.   ergocalciferol 50,000 units Caps capsule Commonly known as: Vitamin D2  TAKE 1 CAPSULE BY MOUTH ONE TIME PER WEEK   ibuprofen 800 mg tablet Commonly known as: ADVIL,MOTRIN  800 mg, Oral, Every 6 hours as needed   linaclotide  145 mcg capsule Commonly known as: LINZESS   TAKE ONE CAPSULE (145 MCG TOTAL) BY MOUTH DAILY.   MULTIPLE  MINERALS-VITAMINS PO  4 tablets, Daily   omeprazole 20 mg capsule Commonly known as: PRILOSEC  20 mg, Oral, Every 24 hours scheduled   ondansetron  8 mg disintegrating tablet Commonly known as: ZOFRAN -ODT  TAKE ONE TABLET (8 MG DOSE) BY MOUTH EVERY 8 (EIGHT) HOURS AS NEEDED FOR NAUSEA FOR UP TO 30 DAYS.   promethazine 25 MG tablet Commonly known as: PHENERGAN  25 mg, Oral, Every 8 hours as needed   rizatriptan 10 MG tablet Commonly known as: MAXALT  10 mg, Oral, Once as needed, May repeat in 2 hours if needed. Do not take more than two tablets in 24 hours.   simvastatin  40 mg tablet Commonly known as: ZOCOR   40 mg, Oral, At bedtime   zolpidem  tartrate 10 mg tablet Commonly known as: AMBIEN   10 mg, Oral, At bedtime as needed       Discontinued Medications    silver  sulfADIAZINE  1% cream Commonly known as: SILVADENE ,SSD Stopped by: Comer Reid        Risks, benefits, and alternatives of the medications and treatment plan prescribed today were discussed, and patient expressed understanding. Plan follow-up as discussed or as needed if any worsening symptoms or change in condition.    A yearly health maintenance exam was recommended where appropriate.     Subjective   Patient ID:  Kristy Myers is a 68 y.o. (DOB June 13, 1956) female.   Chief Complaint  Patient presents with  . Medicare Wellness Visit     History of Present Illness   Results    Comes in for Medicare annual wellness visit and routine 7-month follow-up for chronic disease state management including dyslipidemia vitamin D deficiency GERD postmenopausal pernicious anemia vitamin D deficiency osteoporosis cigarette nicotine dependence migraines chronic constipation.  She has bee sting allergy and was just recently treated in the ED for bee sting anaphylaxis.  She did inject the EpiPen  she was at the pool with her granddaughter was stung by bee.  However her symptoms did not seem to be improving so  she called EMS.  She is still finishing up a steroid medication  Problem List, Past Medical History, Past Surgical History, Past Family History, Social History, Medications, and Allergies, were reviewed and updated as appropriate.   Review of Systems Review of Systems  Constitutional:  Negative for activity change, appetite change, fatigue and unexpected weight change.  Eyes:  Negative for visual disturbance.  Respiratory:  Negative for cough and shortness of breath.   Cardiovascular:  Negative for chest pain, palpitations and leg swelling.  Gastrointestinal:  Negative for abdominal pain.  Genitourinary:  Negative for dysuria.  Skin:  Negative for color change, rash and wound.  Neurological:  Negative for headaches.  Psychiatric/Behavioral:  Negative for dysphoric mood. The patient is not nervous/anxious.      Objective  BP 150/82 (BP Location: Left Upper Arm, Patient Position: Sitting)   Pulse 63   Temp 97.5 F (36.4 C) (Temporal)   Resp 18   Ht 5' 5 (1.651 m)   Wt 118 lb (53.5 kg)   SpO2 95%   BMI 19.64 kg/m  Physical Exam  Physical Exam Constitutional:      Appearance: Normal appearance. She is well-developed.  Neck:     Vascular: No carotid bruit.  Cardiovascular:     Rate and Rhythm: Normal rate and regular rhythm.  Pulmonary:     Effort: Pulmonary effort is normal.     Breath sounds: Normal breath sounds.  Abdominal:     General: Bowel sounds are normal.     Palpations: Abdomen is soft.  Skin:    General: Skin is warm and dry.  Neurological:     Mental Status: She is alert and oriented to person, place, and time.  Psychiatric:        Mood and Affect: Mood normal.     --------------------------- Labs last 12 hours Recent Results (from the past 2 weeks)  CBC And Differential   Collection Time: 03/05/24  3:02 PM  Result Value Ref Range   WBC 8.7 3.7 - 11.0 thou/mcL   RBC 4.42 4.01 - 4.90 million/mcL   HGB 12.4 12.2 - 14.9 gm/dL   HCT 63.0 64.1 -  52.0 %   MCV 83.6 82.0 - 98.0 fL   MCH 28.2 27.0 - 33.0 pg   MCHC 33.7 31.0 - 37.0 gm/dL   Plt Ct 659 849 - 599 thou/mcL   RDW CV 14.4 11.8 - 14.9 %   NEUTROPHIL % 59.9 %   LYMPHOCYTE % 33.3 %   MID% 6.8 %   ABSOLUTE NEUTROPHIL COUNT 5.20 1.50 - 7.50 thou/mcL   ABSOLUTE LYMPHOCYTE COUNT 2.90 1.00 - 4.50 thou/mcL   ABSOLUTE MID 0.6 0.1 - 1.5 thou/mcL    Attestation   *Some images could not be shown.

## 2024-03-09 LAB — TRYPTASE: Tryptase: 21 ug/L — ABNORMAL HIGH (ref 2.2–13.2)

## 2024-04-11 ENCOUNTER — Inpatient Hospital Stay (HOSPITAL_COMMUNITY)
Admission: EM | Admit: 2024-04-11 | Discharge: 2024-04-14 | DRG: 205 | Disposition: A | Discharge status: Home / Self Care | Attending: Internal Medicine | Admitting: Internal Medicine

## 2024-04-11 ENCOUNTER — Emergency Department (HOSPITAL_COMMUNITY)

## 2024-04-11 ENCOUNTER — Other Ambulatory Visit: Payer: Self-pay

## 2024-04-11 ENCOUNTER — Inpatient Hospital Stay (HOSPITAL_COMMUNITY)

## 2024-04-11 ENCOUNTER — Encounter (HOSPITAL_COMMUNITY): Payer: Self-pay | Admitting: Internal Medicine

## 2024-04-11 DIAGNOSIS — J90 Pleural effusion, not elsewhere classified: Secondary | ICD-10-CM | POA: Diagnosis present

## 2024-04-11 DIAGNOSIS — R1110 Vomiting, unspecified: Secondary | ICD-10-CM | POA: Insufficient documentation

## 2024-04-11 DIAGNOSIS — Z87891 Personal history of nicotine dependence: Secondary | ICD-10-CM | POA: Diagnosis not present

## 2024-04-11 DIAGNOSIS — J9601 Acute respiratory failure with hypoxia: Principal | ICD-10-CM | POA: Diagnosis present

## 2024-04-11 DIAGNOSIS — Z7952 Long term (current) use of systemic steroids: Secondary | ICD-10-CM | POA: Diagnosis not present

## 2024-04-11 DIAGNOSIS — D72829 Elevated white blood cell count, unspecified: Secondary | ICD-10-CM | POA: Diagnosis not present

## 2024-04-11 DIAGNOSIS — E782 Mixed hyperlipidemia: Secondary | ICD-10-CM | POA: Diagnosis present

## 2024-04-11 DIAGNOSIS — D72823 Leukemoid reaction: Secondary | ICD-10-CM | POA: Diagnosis present

## 2024-04-11 DIAGNOSIS — Z79899 Other long term (current) drug therapy: Secondary | ICD-10-CM

## 2024-04-11 DIAGNOSIS — R112 Nausea with vomiting, unspecified: Secondary | ICD-10-CM | POA: Diagnosis present

## 2024-04-11 DIAGNOSIS — J440 Chronic obstructive pulmonary disease with acute lower respiratory infection: Secondary | ICD-10-CM | POA: Diagnosis present

## 2024-04-11 DIAGNOSIS — Z7983 Long term (current) use of bisphosphonates: Secondary | ICD-10-CM

## 2024-04-11 DIAGNOSIS — R9389 Abnormal findings on diagnostic imaging of other specified body structures: Secondary | ICD-10-CM

## 2024-04-11 DIAGNOSIS — J189 Pneumonia, unspecified organism: Secondary | ICD-10-CM | POA: Diagnosis not present

## 2024-04-11 DIAGNOSIS — R7989 Other specified abnormal findings of blood chemistry: Secondary | ICD-10-CM | POA: Diagnosis present

## 2024-04-11 DIAGNOSIS — I2510 Atherosclerotic heart disease of native coronary artery without angina pectoris: Secondary | ICD-10-CM | POA: Diagnosis present

## 2024-04-11 DIAGNOSIS — R918 Other nonspecific abnormal finding of lung field: Secondary | ICD-10-CM | POA: Diagnosis not present

## 2024-04-11 DIAGNOSIS — M810 Age-related osteoporosis without current pathological fracture: Secondary | ICD-10-CM | POA: Diagnosis present

## 2024-04-11 DIAGNOSIS — E871 Hypo-osmolality and hyponatremia: Secondary | ICD-10-CM | POA: Diagnosis present

## 2024-04-11 DIAGNOSIS — Z90711 Acquired absence of uterus with remaining cervical stump: Secondary | ICD-10-CM | POA: Diagnosis not present

## 2024-04-11 DIAGNOSIS — J9819 Other pulmonary collapse: Secondary | ICD-10-CM | POA: Diagnosis present

## 2024-04-11 DIAGNOSIS — K219 Gastro-esophageal reflux disease without esophagitis: Secondary | ICD-10-CM | POA: Diagnosis present

## 2024-04-11 DIAGNOSIS — B9789 Other viral agents as the cause of diseases classified elsewhere: Secondary | ICD-10-CM | POA: Diagnosis present

## 2024-04-11 DIAGNOSIS — E872 Acidosis, unspecified: Secondary | ICD-10-CM | POA: Diagnosis present

## 2024-04-11 DIAGNOSIS — Z91030 Bee allergy status: Secondary | ICD-10-CM

## 2024-04-11 DIAGNOSIS — J439 Emphysema, unspecified: Secondary | ICD-10-CM | POA: Diagnosis present

## 2024-04-11 DIAGNOSIS — Z885 Allergy status to narcotic agent status: Secondary | ICD-10-CM

## 2024-04-11 DIAGNOSIS — E869 Volume depletion, unspecified: Secondary | ICD-10-CM | POA: Diagnosis present

## 2024-04-11 DIAGNOSIS — R079 Chest pain, unspecified: Secondary | ICD-10-CM | POA: Diagnosis not present

## 2024-04-11 DIAGNOSIS — Z66 Do not resuscitate: Secondary | ICD-10-CM | POA: Diagnosis present

## 2024-04-11 DIAGNOSIS — T17590A Other foreign object in bronchus causing asphyxiation, initial encounter: Principal | ICD-10-CM | POA: Diagnosis present

## 2024-04-11 DIAGNOSIS — R0789 Other chest pain: Secondary | ICD-10-CM | POA: Insufficient documentation

## 2024-04-11 DIAGNOSIS — I10 Essential (primary) hypertension: Secondary | ICD-10-CM | POA: Diagnosis present

## 2024-04-11 DIAGNOSIS — W44F9XA Other object of natural or organic material, entering into or through a natural orifice, initial encounter: Secondary | ICD-10-CM | POA: Diagnosis present

## 2024-04-11 DIAGNOSIS — Z7401 Bed confinement status: Secondary | ICD-10-CM | POA: Diagnosis not present

## 2024-04-11 DIAGNOSIS — B9710 Unspecified enterovirus as the cause of diseases classified elsewhere: Secondary | ICD-10-CM | POA: Diagnosis present

## 2024-04-11 DIAGNOSIS — I959 Hypotension, unspecified: Secondary | ICD-10-CM | POA: Diagnosis not present

## 2024-04-11 DIAGNOSIS — R059 Cough, unspecified: Secondary | ICD-10-CM | POA: Diagnosis not present

## 2024-04-11 LAB — CBC WITH DIFFERENTIAL/PLATELET
Abs Immature Granulocytes: 0.55 K/uL — ABNORMAL HIGH (ref 0.00–0.07)
Basophils Absolute: 0.1 K/uL (ref 0.0–0.1)
Basophils Relative: 0 %
Eosinophils Absolute: 0 K/uL (ref 0.0–0.5)
Eosinophils Relative: 0 %
HCT: 36.4 % (ref 36.0–46.0)
Hemoglobin: 12.2 g/dL (ref 12.0–15.0)
Immature Granulocytes: 2 %
Lymphocytes Relative: 3 %
Lymphs Abs: 0.8 K/uL (ref 0.7–4.0)
MCH: 28.2 pg (ref 26.0–34.0)
MCHC: 33.5 g/dL (ref 30.0–36.0)
MCV: 84.3 fL (ref 80.0–100.0)
Monocytes Absolute: 1.2 K/uL — ABNORMAL HIGH (ref 0.1–1.0)
Monocytes Relative: 4 %
Neutro Abs: 24 K/uL — ABNORMAL HIGH (ref 1.7–7.7)
Neutrophils Relative %: 91 %
Platelets: 343 K/uL (ref 150–400)
RBC: 4.32 MIL/uL (ref 3.87–5.11)
RDW: 14.3 % (ref 11.5–15.5)
Smear Review: NORMAL
WBC: 26.7 K/uL — ABNORMAL HIGH (ref 4.0–10.5)
nRBC: 0 % (ref 0.0–0.2)

## 2024-04-11 LAB — COMPREHENSIVE METABOLIC PANEL WITH GFR
ALT: 13 U/L (ref 0–44)
AST: 20 U/L (ref 15–41)
Albumin: 3.6 g/dL (ref 3.5–5.0)
Alkaline Phosphatase: 58 U/L (ref 38–126)
Anion gap: 15 (ref 5–15)
BUN: 15 mg/dL (ref 8–23)
CO2: 21 mmol/L — ABNORMAL LOW (ref 22–32)
Calcium: 8.5 mg/dL — ABNORMAL LOW (ref 8.9–10.3)
Chloride: 91 mmol/L — ABNORMAL LOW (ref 98–111)
Creatinine, Ser: 0.75 mg/dL (ref 0.44–1.00)
GFR, Estimated: >60 mL/min (ref >60)
Glucose, Bld: 105 mg/dL — ABNORMAL HIGH (ref 70–99)
Potassium: 3.5 mmol/L (ref 3.5–5.1)
Sodium: 127 mmol/L — ABNORMAL LOW (ref 135–145)
Total Bilirubin: 1.1 mg/dL (ref 0.0–1.2)
Total Protein: 7.3 g/dL (ref 6.5–8.1)

## 2024-04-11 LAB — TROPONIN I (HIGH SENSITIVITY)
Troponin I (High Sensitivity): 6 ng/L (ref <18)
Troponin I (High Sensitivity): 4 ng/L (ref <18)

## 2024-04-11 LAB — PROCALCITONIN: Procalcitonin: 5.47 ng/mL

## 2024-04-11 LAB — RESP PANEL BY RT-PCR (RSV, FLU A&B, COVID)  RVPGX2
Influenza A by PCR: NEGATIVE
Influenza B by PCR: NEGATIVE
Resp Syncytial Virus by PCR: NEGATIVE
SARS Coronavirus 2 by RT PCR: NEGATIVE

## 2024-04-11 LAB — LACTIC ACID, PLASMA: Lactic Acid, Venous: 2.3 mmol/L (ref 0.5–1.9)

## 2024-04-11 LAB — LIPASE, BLOOD: Lipase: 25 U/L (ref 11–51)

## 2024-04-11 LAB — D-DIMER, QUANTITATIVE: D-Dimer, Quant: 1.03 ug{FEU}/mL — ABNORMAL HIGH (ref 0.00–0.50)

## 2024-04-11 MED ORDER — ONDANSETRON HCL 4 MG/2ML IJ SOLN
4.0000 mg | Freq: Four times a day (QID) | INTRAMUSCULAR | Status: DC
  Administered 2024-04-11 – 2024-04-14 (×9): 4 mg via INTRAVENOUS
  Filled 2024-04-11 (×10): qty 2

## 2024-04-11 MED ORDER — HEPARIN SODIUM (PORCINE) 5000 UNIT/ML IJ SOLN
5000.0000 [IU] | Freq: Three times a day (TID) | INTRAMUSCULAR | Status: DC
  Administered 2024-04-11 – 2024-04-14 (×8): 5000 [IU] via SUBCUTANEOUS
  Filled 2024-04-11 (×9): qty 1

## 2024-04-11 MED ORDER — HYDROMORPHONE HCL 1 MG/ML IJ SOLN
0.5000 mg | Freq: Once | INTRAMUSCULAR | Status: AC
  Administered 2024-04-11: 0.5 mg via INTRAVENOUS
  Filled 2024-04-11: qty 0.5

## 2024-04-11 MED ORDER — POTASSIUM CHLORIDE IN NACL 20-0.9 MEQ/L-% IV SOLN
INTRAVENOUS | Status: DC
  Filled 2024-04-11 (×2): qty 1000

## 2024-04-11 MED ORDER — ZOLPIDEM TARTRATE 5 MG PO TABS
5.0000 mg | ORAL_TABLET | Freq: Every evening | ORAL | Status: DC | PRN
  Administered 2024-04-13: 5 mg via ORAL
  Filled 2024-04-11: qty 1

## 2024-04-11 MED ORDER — SODIUM CHLORIDE 0.9 % IV BOLUS
1000.0000 mL | Freq: Once | INTRAVENOUS | Status: AC
  Administered 2024-04-11: 1000 mL via INTRAVENOUS

## 2024-04-11 MED ORDER — SIMVASTATIN 20 MG PO TABS
40.0000 mg | ORAL_TABLET | Freq: Every day | ORAL | Status: DC
  Administered 2024-04-12 – 2024-04-13 (×2): 40 mg via ORAL
  Filled 2024-04-11 (×2): qty 2

## 2024-04-11 MED ORDER — IOHEXOL 350 MG/ML SOLN
75.0000 mL | Freq: Once | INTRAVENOUS | Status: AC | PRN
  Administered 2024-04-11: 75 mL via INTRAVENOUS

## 2024-04-11 MED ORDER — LINACLOTIDE 145 MCG PO CAPS
145.0000 ug | ORAL_CAPSULE | Freq: Every day | ORAL | Status: DC
  Administered 2024-04-12 – 2024-04-14 (×2): 145 ug via ORAL
  Filled 2024-04-11 (×4): qty 1

## 2024-04-11 MED ORDER — SODIUM CHLORIDE 0.9 % IV SOLN
500.0000 mg | Freq: Once | INTRAVENOUS | Status: AC
  Administered 2024-04-11: 500 mg via INTRAVENOUS
  Filled 2024-04-11: qty 5

## 2024-04-11 MED ORDER — ONDANSETRON HCL 4 MG/2ML IJ SOLN
4.0000 mg | Freq: Once | INTRAMUSCULAR | Status: AC
  Administered 2024-04-11: 4 mg via INTRAVENOUS
  Filled 2024-04-11: qty 2

## 2024-04-11 MED ORDER — METOCLOPRAMIDE HCL 5 MG/ML IJ SOLN
5.0000 mg | Freq: Once | INTRAMUSCULAR | Status: AC
  Administered 2024-04-11: 5 mg via INTRAVENOUS
  Filled 2024-04-11: qty 2

## 2024-04-11 MED ORDER — HYDROMORPHONE HCL 1 MG/ML IJ SOLN
0.5000 mg | INTRAMUSCULAR | Status: DC | PRN
  Administered 2024-04-11 – 2024-04-13 (×8): 0.5 mg via INTRAVENOUS
  Filled 2024-04-11 (×8): qty 1

## 2024-04-11 MED ORDER — LACTATED RINGERS IV BOLUS
1000.0000 mL | Freq: Once | INTRAVENOUS | Status: AC
  Administered 2024-04-11: 1000 mL via INTRAVENOUS

## 2024-04-11 MED ORDER — SODIUM CHLORIDE 0.9 % IV SOLN
2.0000 g | Freq: Once | INTRAVENOUS | Status: AC
  Administered 2024-04-11: 2 g via INTRAVENOUS
  Filled 2024-04-11: qty 20

## 2024-04-11 MED ORDER — PANTOPRAZOLE SODIUM 40 MG IV SOLR
40.0000 mg | Freq: Two times a day (BID) | INTRAVENOUS | Status: DC
  Administered 2024-04-11 – 2024-04-14 (×6): 40 mg via INTRAVENOUS
  Filled 2024-04-11 (×6): qty 10

## 2024-04-11 MED ORDER — HYDROMORPHONE HCL 1 MG/ML IJ SOLN
1.0000 mg | Freq: Once | INTRAMUSCULAR | Status: AC
  Administered 2024-04-11: 1 mg via INTRAVENOUS
  Filled 2024-04-11: qty 1

## 2024-04-11 NOTE — ED Triage Notes (Signed)
 Pt arrived via POV. Pt complaining of chest pain on the right side. Pt states it's tender to the touch. Pt says she's been having N/V since yesterday. Pt states she fell a week ago and fell straight on her bottom and didn't know if this was related. Chest pain started yesterday. Pt states pain is rated 8/10. Lung sounds clear Bilat. Pain upon palpation to chest.

## 2024-04-11 NOTE — ED Provider Notes (Signed)
 [facility] AT [facility] Provider Note   CSN: [id number] Arrival date & time: 04/11/24  9040     Patient presents with: Chest Pain   Kristy Myers is a 68 y.o. female.   Patient has a history of COPD and coronary artery disease.  Patient complains of right-sided chest discomfort worse with inspiration  The history is provided by the patient and medical records. No language interpreter was used.  Chest Pain Pain location:  R chest Pain quality: aching   Pain radiates to:  Does not radiate Pain severity:  Moderate Onset quality:  Sudden Timing:  Constant Progression:  Worsening Chronicity:  New Context: breathing   Associated symptoms: no abdominal pain, no back pain, no cough, no fatigue and no headache        Prior to Admission medications   Medication Sig Start Date End Date Taking? Authorizing Provider  alendronate (FOSAMAX) 70 MG tablet Take 70 mg by mouth once a week.    [provider]  EPINEPHrine  0.3 mg/0.3 mL IJ SOAJ injection Inject 0.3 mg into the muscle as needed for anaphylaxis. 03/02/24   Midge Golas, MD  ergocalciferol (VITAMIN D2) 1.25 MG (50000 UT) capsule Take 50,000 Units by mouth once a week.    [provider]  ibuprofen (ADVIL,MOTRIN) 800 MG tablet Take 800 mg by mouth every 8 (eight) hours as needed for moderate pain or headache.    [provider]  LINZESS  145 MCG CAPS capsule Take 145 mcg by mouth daily as needed for constipation. 08/19/21   [provider]  Multiple Vitamins-Minerals (MULTIVITAMIN GUMMIES ADULTS PO) Take 4 tablets by mouth daily.    [provider]  omeprazole (PRILOSEC) 20 MG capsule Take 20 mg by mouth daily as needed (heartburn). 06/27/13   [provider]  predniSONE  (DELTASONE ) 50 MG tablet 1 tablet PO QD X4 days 03/02/24   Midge Golas, MD  promethazine  (PHENERGAN ) 25 MG tablet Take 25 mg by mouth every 8 (eight) hours as needed for  nausea/vomiting. 08/02/21   [provider]  rizatriptan (MAXALT) 10 MG tablet Take 10 mg by mouth as needed for migraine. 08/30/21   [provider]  simvastatin  (ZOCOR ) 40 MG tablet Take 40 mg by mouth at bedtime.    [provider]  zolpidem  (AMBIEN ) 10 MG tablet Take 10 mg by mouth at bedtime as needed for sleep.    [provider]    Allergies: Bee venom, Fire ant, Wasp venom, and Codeine    Review of Systems  Constitutional:  Negative for appetite change and fatigue.  HENT:  Negative for congestion, ear discharge and sinus pressure.   Eyes:  Negative for discharge.  Respiratory:  Negative for cough.   Cardiovascular:  Positive for chest pain.  Gastrointestinal:  Negative for abdominal pain and diarrhea.  Genitourinary:  Negative for frequency and hematuria.  Musculoskeletal:  Negative for back pain.  Skin:  Negative for rash.  Neurological:  Negative for seizures and headaches.  Psychiatric/Behavioral:  Negative for hallucinations.     Updated Vital Signs BP (!) 117/56   Pulse 64   Temp 98.1 F (36.7 C) (Oral)   Resp 19   Ht 5' 5 (1.651 m)   Wt 53.5 kg   SpO2 94%   BMI 19.64 kg/m   Physical Exam Vitals and nursing note reviewed.  Constitutional:      Appearance: She is well-developed.  HENT:     Head: Normocephalic.  Nose: Nose normal.  Eyes:     General: No scleral icterus.    Conjunctiva/sclera: Conjunctivae normal.  Neck:     Thyroid: No thyromegaly.  Cardiovascular:     Rate and Rhythm: Normal rate and regular rhythm.     Heart sounds: No murmur heard.    No friction rub. No gallop.  Pulmonary:     Breath sounds: No stridor. No wheezing or rales.  Chest:     Chest wall: No tenderness.  Abdominal:     General: There is no distension.     Tenderness: There is no abdominal tenderness. There is no rebound.  Musculoskeletal:        General: Normal range of motion.     Cervical back: Neck supple.  Lymphadenopathy:      Cervical: No cervical adenopathy.  Skin:    Findings: No erythema or rash.  Neurological:     Mental Status: She is alert and oriented to person, place, and time.     Motor: No abnormal muscle tone.     Coordination: Coordination normal.  Psychiatric:        Behavior: Behavior normal.     (all labs ordered are listed, but only abnormal results are displayed) Labs Reviewed  CBC WITH DIFFERENTIAL/PLATELET - Abnormal; Notable for the following components:      Result Value   WBC 26.7 (*)    Neutro Abs 24.0 (*)    Monocytes Absolute 1.2 (*)    Abs Immature Granulocytes 0.55 (*)    All other components within normal limits  COMPREHENSIVE METABOLIC PANEL WITH GFR - Abnormal; Notable for the following components:   Sodium 127 (*)    Chloride 91 (*)    CO2 21 (*)    Glucose, Bld 105 (*)    Calcium 8.5 (*)    All other components within normal limits  D-DIMER, QUANTITATIVE - Abnormal; Notable for the following components:   D-Dimer, Quant 1.03 (*)    All other components within normal limits  TROPONIN I (HIGH SENSITIVITY)  TROPONIN I (HIGH SENSITIVITY)    EKG: EKG Interpretation Date/Time:  Wednesday April 11 2024 10:22:52 EDT Ventricular Rate:  73 PR Interval:  161 QRS Duration:  116 QT Interval:  403 QTC Calculation: 445 R Axis:   76  Text Interpretation: Sinus rhythm Nonspecific intraventricular conduction delay Confirmed by Suzette Pac [phone]) on 04/11/2024 10:32:00 AM  Radiology: CT Angio Chest PE W and/or Wo Contrast Result Date: 04/11/2024 CLINICAL DATA:  Chest abdominal pain.  Nausea and vomiting. EXAM: CT ANGIOGRAPHY CHEST CT ABDOMEN AND PELVIS WITH CONTRAST TECHNIQUE: Multidetector CT imaging of the chest was performed using the standard protocol during bolus administration of intravenous contrast. Multiplanar CT image reconstructions and MIPs were obtained to evaluate the vascular anatomy. Multidetector CT imaging of the abdomen and pelvis was performed using  the standard protocol during bolus administration of intravenous contrast. RADIATION DOSE REDUCTION: This exam was performed according to the departmental dose-optimization program which includes automated exposure control, adjustment of the mA and/or kV according to patient size and/or use of iterative reconstruction technique. CONTRAST:  75mL OMNIPAQUE  IOHEXOL  350 MG/ML SOLN COMPARISON:  Chest radiograph dated 04/11/2024. FINDINGS: Evaluation of this exam is limited due to respiratory motion. CTA CHEST FINDINGS Cardiovascular: There is no cardiomegaly or pericardial effusion. Mild atherosclerotic calcification of the thoracic aorta. No aneurysm dilatation or dissection. The origins of the great vessels of the aortic arch are patent. No pulmonary artery embolus identified. Mediastinum/Nodes: No hilar or  mediastinal adenopathy. The esophagus is grossly unremarkable. No mediastinal fluid collection. Lungs/Pleura: There is complete collapse of the right middle lobe. There is a luminal narrowing of the right middle lobe bronchus which may be due to mucous impaction. An endobronchial lesion is not excluded. Clinical correlation recommended bronchoscopy may provide better evaluation. There is background of emphysema. Small right pleural effusion. No pneumothorax. Musculoskeletal: Osteopenia with degenerative changes of the spine. No acute osseous pathology. Review of the MIP images confirms the above findings. CT ABDOMEN and PELVIS FINDINGS No intra-abdominal free air.  Small free fluid in the pelvis. Hepatobiliary: The liver is unremarkable. There is mild dilatation versus mild periportal edema. The gallbladder is unremarkable. There is a small pericholecystic fluid. Pancreas: Unremarkable. No pancreatic ductal dilatation or surrounding inflammatory changes. Spleen: Normal in size without focal abnormality. Adrenals/Urinary Tract: The adrenal glands unremarkable. Small right renal interpolar cyst. There is no  hydronephrosis on either side. There is symmetric enhancement and excretion of contrast by both kidneys. The visualized ureters and urinary bladder appear unremarkable. Stomach/Bowel: There is postsurgical changes of the bowel with anastomotic staple line in the right lower abdomen. There is no bowel obstruction or active inflammation. Appendectomy. Vascular/Lymphatic: Moderate aortoiliac atherosclerotic disease. The IVC is unremarkable. No portal venous gas. There is no adenopathy. Reproductive: Hysterectomy.  No suspicious adnexal masses. Other: None Musculoskeletal: Osteopenia with degenerative changes of the spine. No acute osseous pathology. Review of the MIP images confirms the above findings. IMPRESSION: 1. No CT evidence of pulmonary artery embolus. 2. Complete collapse of the right middle lobe with luminal narrowing of the right middle lobe bronchus which may be due to mucous impaction. An endobronchial lesion is not excluded. Bronchoscopy may provide better evaluation. 3. Small right pleural effusion. 4. Mild periportal edema and trace pericholecystic fluid. Correlation with LFTs recommended. 5. No bowel obstruction. 6. Aortic Atherosclerosis (ICD10-I70.0) and Emphysema (ICD10-J43.9). Electronically Signed   By: Vanetta Chou M.D.   On: 04/11/2024 12:46   CT ABDOMEN PELVIS W CONTRAST Result Date: 04/11/2024 CLINICAL DATA:  Chest abdominal pain.  Nausea and vomiting. EXAM: CT ANGIOGRAPHY CHEST CT ABDOMEN AND PELVIS WITH CONTRAST TECHNIQUE: Multidetector CT imaging of the chest was performed using the standard protocol during bolus administration of intravenous contrast. Multiplanar CT image reconstructions and MIPs were obtained to evaluate the vascular anatomy. Multidetector CT imaging of the abdomen and pelvis was performed using the standard protocol during bolus administration of intravenous contrast. RADIATION DOSE REDUCTION: This exam was performed according to the departmental dose-optimization  program which includes automated exposure control, adjustment of the mA and/or kV according to patient size and/or use of iterative reconstruction technique. CONTRAST:  75mL OMNIPAQUE  IOHEXOL  350 MG/ML SOLN COMPARISON:  Chest radiograph dated 04/11/2024. FINDINGS: Evaluation of this exam is limited due to respiratory motion. CTA CHEST FINDINGS Cardiovascular: There is no cardiomegaly or pericardial effusion. Mild atherosclerotic calcification of the thoracic aorta. No aneurysm dilatation or dissection. The origins of the great vessels of the aortic arch are patent. No pulmonary artery embolus identified. Mediastinum/Nodes: No hilar or mediastinal adenopathy. The esophagus is grossly unremarkable. No mediastinal fluid collection. Lungs/Pleura: There is complete collapse of the right middle lobe. There is a luminal narrowing of the right middle lobe bronchus which may be due to mucous impaction. An endobronchial lesion is not excluded. Clinical correlation recommended bronchoscopy may provide better evaluation. There is background of emphysema. Small right pleural effusion. No pneumothorax. Musculoskeletal: Osteopenia with degenerative changes of the spine. No acute osseous pathology.  Review of the MIP images confirms the above findings. CT ABDOMEN and PELVIS FINDINGS No intra-abdominal free air.  Small free fluid in the pelvis. Hepatobiliary: The liver is unremarkable. There is mild dilatation versus mild periportal edema. The gallbladder is unremarkable. There is a small pericholecystic fluid. Pancreas: Unremarkable. No pancreatic ductal dilatation or surrounding inflammatory changes. Spleen: Normal in size without focal abnormality. Adrenals/Urinary Tract: The adrenal glands unremarkable. Small right renal interpolar cyst. There is no hydronephrosis on either side. There is symmetric enhancement and excretion of contrast by both kidneys. The visualized ureters and urinary bladder appear unremarkable. Stomach/Bowel:  There is postsurgical changes of the bowel with anastomotic staple line in the right lower abdomen. There is no bowel obstruction or active inflammation. Appendectomy. Vascular/Lymphatic: Moderate aortoiliac atherosclerotic disease. The IVC is unremarkable. No portal venous gas. There is no adenopathy. Reproductive: Hysterectomy.  No suspicious adnexal masses. Other: None Musculoskeletal: Osteopenia with degenerative changes of the spine. No acute osseous pathology. Review of the MIP images confirms the above findings. IMPRESSION: 1. No CT evidence of pulmonary artery embolus. 2. Complete collapse of the right middle lobe with luminal narrowing of the right middle lobe bronchus which may be due to mucous impaction. An endobronchial lesion is not excluded. Bronchoscopy may provide better evaluation. 3. Small right pleural effusion. 4. Mild periportal edema and trace pericholecystic fluid. Correlation with LFTs recommended. 5. No bowel obstruction. 6. Aortic Atherosclerosis (ICD10-I70.0) and Emphysema (ICD10-J43.9). Electronically Signed   By: Vanetta Chou M.D.   On: 04/11/2024 12:46   DG Chest Port 1 View Result Date: 04/11/2024 CLINICAL DATA:  Right-sided chest pain and tenderness.  Recent fall. EXAM: PORTABLE CHEST 1 VIEW COMPARISON:  07/18/2013 FINDINGS: The heart size and mediastinal contours are within normal limits. Both lungs are clear. No pneumothorax or hemothorax visualized. The visualized skeletal structures are unremarkable. IMPRESSION: No active disease. Electronically Signed   By: Norleen DELENA Kil M.D.   On: 04/11/2024 11:11     Procedures   Medications Ordered in the ED  azithromycin  (ZITHROMAX ) 500 mg in sodium chloride  0.9 % 250 mL IVPB (has no administration in time range)  sodium chloride  0.9 % bolus 1,000 mL (0 mLs Intravenous Stopped 04/11/24 1330)  HYDROmorphone  (DILAUDID ) injection 0.5 mg (0.5 mg Intravenous Given 04/11/24 1041)  HYDROmorphone  (DILAUDID ) injection 1 mg (1 mg  Intravenous Given 04/11/24 1138)  iohexol  (OMNIPAQUE ) 350 MG/ML injection 75 mL (75 mLs Intravenous Contrast Given 04/11/24 1220)  ondansetron  (ZOFRAN ) injection 4 mg (4 mg Intravenous Given 04/11/24 1225)  cefTRIAXone  (ROCEPHIN ) 2 g in sodium chloride  0.9 % 100 mL IVPB (2 g Intravenous New Bag/Given 04/11/24 1330)   CT scan shows collapse of the right middle lobe.  I spoke with critical care Dr. Theophilus and he recommended the patient to be admitted over at a [facility].  Patient should be admitted to the hospitalist with pulmonary consult    CRITICAL CARE Performed by: Fairy Sermon Total critical care time: 45 minutes Critical care time was exclusive of separately billable procedures and treating other patients. Critical care was necessary to treat or prevent imminent or life-threatening deterioration. Critical care was time spent personally by me on the following activities: development of treatment plan with patient and/or surrogate as well as nursing, discussions with consultants, evaluation of patient's response to treatment, examination of patient, obtaining history from patient or surrogate, ordering and performing treatments and interventions, ordering and review of laboratory studies, ordering and review of radiographic studies, pulse oximetry and re-evaluation of  patient's condition.                                  Medical Decision Making Amount and/or Complexity of Data Reviewed Labs: ordered. Radiology: ordered.  Risk Prescription drug management. Decision regarding hospitalization.   Collapse of right middle lobe.  Possible infection or mucous plug or tumor     Final diagnoses:  Community acquired pneumonia of right middle lobe of lung    ED Discharge Orders     None          Suzette Pac, MD 04/15/24 1211

## 2024-04-11 NOTE — Hospital Course (Signed)
 68 year old female with a history of COPD, tobacco abuse, vitamin D deficiency, hypertension, hyperlipidemia, migraine headache, pernicious anemia presenting with right-sided chest pain that began around 11 AM on 04/10/2024.  She also had nausea and vomiting that began around that time.  She states that she was sitting in a chair at the time at rest when her symptoms began.  She has not had any fevers, chills, headache, neck pain.  She quit smoking about 1 month ago after 30-pack-year history.  She states that her chest pain is sharp and stabbing and constant since 04/10/2024.  She took one of her spouses  pain medicine which helped.  However the pain returned when the medicine wore off.  She had numerous episodes of emesis with dry heaving.  There is no hematemesis.  She denies any abdominal pain, diarrhea, hematochezia, melena.  She has not had any dysuria or hematuria.  She has a nonproductive cough.  She denies any other new medicines.  She denies any recent anginal type symptoms.  She denies any worsening shortness of breath. In the ED, the patient was afebrile and hemodynamically stable with oxygen saturation 88 to 94% on room air.  WBC 26.7, hemoglobin 12.2, platelets 343.  Sodium 127, potassium 3.5, bicarbonate 21, serum creatinine 0.75.  LFTs were unremarkable.  CTA chest was negative for PE; negative for aortic dissection.  There was complete collapse of the RML with luminal narrowing of the RML bronchus which may be secondary to mucus impaction versus endobronchial lesion.  CT of the abdomen and pelvis showed mild periportal edema and trace pericholecystic fluid.  The gallbladder appeared normal.  The patient was started on ceftriaxone  and azithromycin . He spoke with pulmonary medicine, Dr. Theophilus who requested transfer to [facility] as pt will need bronchoscopy.

## 2024-04-11 NOTE — H&P (Signed)
 History and Physical    Patient: Kristy Myers FMW:[id number] DOB: 1955/12/23 DOA: 04/11/2024 DOS: the patient was seen and examined on 04/11/2024 PCP: Baird Comer [name], NP  Patient coming from: Home  Chief Complaint:  Chief Complaint  Patient presents with   Chest Pain   HPI: Kristy Myers is a 68 year old female with a history of COPD, tobacco abuse, vitamin D deficiency, hypertension, hyperlipidemia, migraine headache, pernicious anemia presenting with right-sided chest pain that began around 11 AM on 04/10/2024.  She also had nausea and vomiting that began around that time.  She states that she was sitting in a chair at the time at rest when her symptoms began.  She has not had any fevers, chills, headache, neck pain.  She quit smoking about 1 month ago after 30-pack-year history.  She states that her chest pain is sharp and stabbing and constant since 04/10/2024.  She took one of her spouses  pain medicine which helped.  However the pain returned when the medicine wore off.  She had numerous episodes of emesis with dry heaving.  There is no hematemesis.  She denies any abdominal pain, diarrhea, hematochezia, melena.  She has not had any dysuria or hematuria.  She has a nonproductive cough.  She denies any other new medicines.  She denies any recent anginal type symptoms.  She denies any worsening shortness of breath. In the ED, the patient was afebrile and hemodynamically stable with oxygen saturation 88 to 94% on room air.  WBC 26.7, hemoglobin 12.2, platelets 343.  Sodium 127, potassium 3.5, bicarbonate 21, serum creatinine 0.75.  LFTs were unremarkable.  CTA chest was negative for PE; negative for aortic dissection.  There was complete collapse of the RML with luminal narrowing of the RML bronchus which may be secondary to mucus impaction versus endobronchial lesion.  CT of the abdomen and pelvis showed mild periportal edema and trace pericholecystic fluid.  The gallbladder appeared normal.   The patient was started on ceftriaxone  and azithromycin . He spoke with pulmonary medicine, Dr. Theophilus who requested transfer to [facility] as pt will need bronchoscopy.  Review of Systems: As mentioned in the history of present illness. All other systems reviewed and are negative. Past Medical History:  Diagnosis Date   History of nuclear stress test 06/11/2011   lexiscan; normal pattern of perfusion, low risk scan    Syncope    Past Surgical History:  Procedure Laterality Date   APPENDECTOMY     COLON SURGERY  06/2009   COLONOSCOPY WITH PROPOFOL  N/A 09/18/2021   Procedure: COLONOSCOPY WITH PROPOFOL ;  Surgeon: Cindie Carlin POUR, DO;  Location: [facility];  Service: Endoscopy;  Laterality: N/A;  12:30 / ASA II   LEFT HEART CATHETERIZATION WITH CORONARY ANGIOGRAM N/A 07/25/2013   Procedure: LEFT HEART CATHETERIZATION WITH CORONARY ANGIOGRAM;  Surgeon: Vinie KYM Maxcy, MD;  Location: [facility] CATH LAB;  Service: Cardiovascular;  Laterality: N/A;   PARTIAL HYSTERECTOMY     POLYPECTOMY  09/18/2021   Procedure: POLYPECTOMY;  Surgeon: Cindie Carlin POUR, DO;  Location: [facility];  Service: Endoscopy;;   Social History:  reports that she quit smoking about 2 years ago. Her smoking use included cigarettes. She started smoking about 35 years ago. She has a 33 pack-year smoking history. She has never used smokeless tobacco. She reports that she does not drink alcohol and does not use drugs.  Allergies  Allergen Reactions   Bee Venom Anaphylaxis   Fire Ant Anaphylaxis and Hives  Wasp Venom Anaphylaxis, Hives, Shortness Of Breath and Swelling   Codeine Itching    Family History  Problem Relation Age of Onset   Cancer Father    Heart Problems Maternal Grandmother    Other Mother        sepsis    Prior to Admission medications   Medication Sig Start Date End Date Taking? Authorizing Provider  alendronate (FOSAMAX) 70 MG tablet Take 70 mg by mouth once a week.    [provider]   EPINEPHrine  0.3 mg/0.3 mL IJ SOAJ injection Inject 0.3 mg into the muscle as needed for anaphylaxis. 03/02/24   Midge Golas, MD  ergocalciferol (VITAMIN D2) 1.25 MG (50000 UT) capsule Take 50,000 Units by mouth once a week.    [provider]  ibuprofen (ADVIL,MOTRIN) 800 MG tablet Take 800 mg by mouth every 8 (eight) hours as needed for moderate pain or headache.    [provider]  LINZESS  145 MCG CAPS capsule Take 145 mcg by mouth daily as needed for constipation. 08/19/21   [provider]  Multiple Vitamins-Minerals (MULTIVITAMIN GUMMIES ADULTS PO) Take 4 tablets by mouth daily.    [provider]  omeprazole (PRILOSEC) 20 MG capsule Take 20 mg by mouth daily as needed (heartburn). 06/27/13   [provider]  predniSONE  (DELTASONE ) 50 MG tablet 1 tablet PO QD X4 days 03/02/24   Midge Golas, MD  promethazine (PHENERGAN) 25 MG tablet Take 25 mg by mouth every 8 (eight) hours as needed for nausea/vomiting. 08/02/21   [provider]  rizatriptan (MAXALT) 10 MG tablet Take 10 mg by mouth as needed for migraine. 08/30/21   [provider]  simvastatin  (ZOCOR ) 40 MG tablet Take 40 mg by mouth at bedtime.    [provider]  zolpidem  (AMBIEN ) 10 MG tablet Take 10 mg by mouth at bedtime as needed for sleep.    [provider]    Physical Exam: Vitals:   04/11/24 1153 04/11/24 1201 04/11/24 1202 04/11/24 1315  BP:    (!) 117/56  Pulse: 72 72 69 64  Resp: (!) 23 20 (!) 25 19  Temp:      TempSrc:      SpO2: 91% 94% 95% 94%  Weight:      Height:       GENERAL:  A&O x 3, NAD, well developed, cooperative, follows commands HEENT: [place]/AT, No thrush, No icterus, No oral ulcers Neck:  No neck mass, No meningismus, soft, supple CV: RRR, no S3, no S4, no rub, no JVD Lungs: Diminished breath sounds bilateral.  Bibasilar rales.  No wheezing. Abd: soft/NT +BS, nondistended Ext: No edema, no lymphangitis, no  cyanosis, no rashes Neuro:  CN II-XII intact, strength 4/5 in RUE, RLE, strength 4/5 LUE, LLE; sensation intact bilateral; no dysmetria; babinski equivocal  Data Reviewed: Data reviewed above in the history  Assessment and Plan: RML Opacity/collapse - CTA chest as discussed above - concerned about obstructive pneumonia - EDP spoke with pulmonary, Dr. Montine to [facility] for consult and bronchoscopy - Check PCT - Continue azithromycin  and ceftriaxone  - Start bronchodilators  Intractable nausea and vomiting - Etiology unclear - Start pantoprazole  - Start antiemetics around-the-clock - Obtain UA - 04/11/2024 CT [facility] negative for bowel obstruction or inflammation.  Negative hydronephrosis.  Showed mild periportal edema and trace pericholecystic fluid - LFTs unremarkable - Check lipase - Start IV fluids - RUQ US  - clear liquids for now - pantoprazole  bid  Leukemoid reaction - Check  lactic acid - PCT - Blood cultures - Continue ceftriaxone  and azithromycin   Atypical chest pain - Troponin 6>> 4 - Partly reproducible - Echocardiogram  Mixed hyperlipidemia - Continue statin  COPD - Start bronchodilators - Stable on room air  Hyponatremia - Secondary to volume depletion - Start IV fluids    Advance Care Planning: FULL  Consults: pulmonary  Family Communication: spouse 7/23  Severity of Illness: The appropriate patient status for this patient is INPATIENT. Inpatient status is judged to be reasonable and necessary in order to provide the required intensity of service to ensure the patient's safety. The patient's presenting symptoms, physical exam findings, and initial radiographic and laboratory data in the context of their chronic comorbidities is felt to place them at high risk for further clinical deterioration. Furthermore, it is not anticipated that the patient will be medically stable for discharge from the hospital within 2 midnights of admission.   * I  certify that at the point of admission it is my clinical judgment that the patient will require inpatient hospital care spanning beyond 2 midnights from the point of admission due to high intensity of service, high risk for further deterioration and high frequency of surveillance required.*  Author: Alm Schneider, MD 04/11/2024 2:42 PM  For on call review [email or web address].

## 2024-04-12 ENCOUNTER — Inpatient Hospital Stay (HOSPITAL_COMMUNITY)

## 2024-04-12 ENCOUNTER — Inpatient Hospital Stay (HOSPITAL_COMMUNITY): Admitting: Certified Registered Nurse Anesthetist

## 2024-04-12 ENCOUNTER — Encounter (HOSPITAL_COMMUNITY): Payer: Self-pay | Admitting: Internal Medicine

## 2024-04-12 ENCOUNTER — Encounter (HOSPITAL_COMMUNITY)
Admission: EM | Disposition: A | Discharge status: Home / Self Care | Payer: Self-pay | Source: Home / Self Care | Attending: Internal Medicine

## 2024-04-12 DIAGNOSIS — J9819 Other pulmonary collapse: Secondary | ICD-10-CM | POA: Diagnosis not present

## 2024-04-12 DIAGNOSIS — D72829 Elevated white blood cell count, unspecified: Secondary | ICD-10-CM | POA: Diagnosis not present

## 2024-04-12 DIAGNOSIS — R079 Chest pain, unspecified: Secondary | ICD-10-CM | POA: Diagnosis not present

## 2024-04-12 DIAGNOSIS — J189 Pneumonia, unspecified organism: Secondary | ICD-10-CM

## 2024-04-12 DIAGNOSIS — J9601 Acute respiratory failure with hypoxia: Secondary | ICD-10-CM | POA: Diagnosis not present

## 2024-04-12 DIAGNOSIS — Z87891 Personal history of nicotine dependence: Secondary | ICD-10-CM | POA: Diagnosis not present

## 2024-04-12 DIAGNOSIS — R059 Cough, unspecified: Secondary | ICD-10-CM | POA: Diagnosis not present

## 2024-04-12 DIAGNOSIS — R918 Other nonspecific abnormal finding of lung field: Secondary | ICD-10-CM

## 2024-04-12 DIAGNOSIS — R9389 Abnormal findings on diagnostic imaging of other specified body structures: Secondary | ICD-10-CM

## 2024-04-12 HISTORY — PX: VIDEO BRONCHOSCOPY: SHX5072

## 2024-04-12 LAB — COMPREHENSIVE METABOLIC PANEL WITH GFR
ALT: 12 U/L (ref 0–44)
AST: 13 U/L — ABNORMAL LOW (ref 15–41)
Albumin: 2.6 g/dL — ABNORMAL LOW (ref 3.5–5.0)
Alkaline Phosphatase: 46 U/L (ref 38–126)
Anion gap: 8 (ref 5–15)
BUN: 12 mg/dL (ref 8–23)
CO2: 23 mmol/L (ref 22–32)
Calcium: 8.1 mg/dL — ABNORMAL LOW (ref 8.9–10.3)
Chloride: 101 mmol/L (ref 98–111)
Creatinine, Ser: 0.57 mg/dL (ref 0.44–1.00)
GFR, Estimated: >60 mL/min (ref >60)
Glucose, Bld: 99 mg/dL (ref 70–99)
Potassium: 3.9 mmol/L (ref 3.5–5.1)
Sodium: 132 mmol/L — ABNORMAL LOW (ref 135–145)
Total Bilirubin: 0.7 mg/dL (ref 0.0–1.2)
Total Protein: 5.6 g/dL — ABNORMAL LOW (ref 6.5–8.1)

## 2024-04-12 LAB — ECHOCARDIOGRAM COMPLETE
AR max vel: 2.4 cm2
AV Area VTI: 2.43 cm2
AV Area mean vel: 2.29 cm2
AV Mean grad: 5 mmHg
AV Peak grad: 9.7 mmHg
Ao pk vel: 1.56 m/s
Area-P 1/2: 4.21 cm2
Height: 65 in
MV M vel: 5.65 m/s
MV Peak grad: 127.7 mmHg
S' Lateral: 3.4 cm
Weight: 1888 [oz_av]

## 2024-04-12 LAB — MRSA NEXT GEN BY PCR, NASAL: MRSA by PCR Next Gen: NOT DETECTED

## 2024-04-12 LAB — CBC
HCT: 29.9 % — ABNORMAL LOW (ref 36.0–46.0)
Hemoglobin: 10.1 g/dL — ABNORMAL LOW (ref 12.0–15.0)
MCH: 28.4 pg (ref 26.0–34.0)
MCHC: 33.8 g/dL (ref 30.0–36.0)
MCV: 84 fL (ref 80.0–100.0)
Platelets: 275 K/uL (ref 150–400)
RBC: 3.56 MIL/uL — ABNORMAL LOW (ref 3.87–5.11)
RDW: 14.7 % (ref 11.5–15.5)
WBC: 19.3 K/uL — ABNORMAL HIGH (ref 4.0–10.5)
nRBC: 0 % (ref 0.0–0.2)

## 2024-04-12 LAB — RESPIRATORY PANEL BY PCR

## 2024-04-12 LAB — BODY FLUID CELL COUNT WITH DIFFERENTIAL
Eos, Fluid: 1 %
Lymphs, Fluid: 2 %
Monocyte-Macrophage-Serous Fluid: 9 % — ABNORMAL LOW (ref 50–90)
Neutrophil Count, Fluid: 88 % — ABNORMAL HIGH (ref 0–25)
Total Nucleated Cell Count, Fluid: 985 uL (ref 0–1000)

## 2024-04-12 LAB — MAGNESIUM: Magnesium: 1.7 mg/dL (ref 1.7–2.4)

## 2024-04-12 LAB — HIV ANTIBODY (ROUTINE TESTING W REFLEX): HIV Screen 4th Generation wRfx: NONREACTIVE

## 2024-04-12 SURGERY — VIDEO BRONCHOSCOPY WITHOUT FLUORO
Anesthesia: General

## 2024-04-12 MED ORDER — SUCCINYLCHOLINE CHLORIDE 200 MG/10ML IV SOSY
PREFILLED_SYRINGE | INTRAVENOUS | Status: DC | PRN
  Administered 2024-04-12: 80 mg via INTRAVENOUS

## 2024-04-12 MED ORDER — SODIUM CHLORIDE 0.9 % IV SOLN
2.0000 g | INTRAVENOUS | Status: DC
  Administered 2024-04-12 – 2024-04-14 (×3): 2 g via INTRAVENOUS
  Filled 2024-04-12 (×3): qty 20

## 2024-04-12 MED ORDER — SODIUM CHLORIDE 0.9 % IV SOLN
500.0000 mg | INTRAVENOUS | Status: DC
  Administered 2024-04-12 – 2024-04-13 (×2): 500 mg via INTRAVENOUS
  Filled 2024-04-12 (×2): qty 5

## 2024-04-12 MED ORDER — PROPOFOL 10 MG/ML IV BOLUS
INTRAVENOUS | Status: DC | PRN
  Administered 2024-04-12: 100 mg via INTRAVENOUS

## 2024-04-12 MED ORDER — PROPOFOL 500 MG/50ML IV EMUL
INTRAVENOUS | Status: DC | PRN
  Administered 2024-04-12: 125 ug/kg/min via INTRAVENOUS

## 2024-04-12 MED ORDER — SODIUM CHLORIDE 0.9 % IV SOLN: INTRAVENOUS | Status: DC | PRN

## 2024-04-12 MED ORDER — SODIUM CHLORIDE 0.9 % IV SOLN
INTRAVENOUS | Status: AC | PRN
  Administered 2024-04-12: 500 mL via INTRAMUSCULAR

## 2024-04-12 MED ORDER — OXYCODONE HCL 5 MG PO TABS
5.0000 mg | ORAL_TABLET | Freq: Four times a day (QID) | ORAL | Status: DC | PRN
  Administered 2024-04-12 – 2024-04-14 (×3): 5 mg via ORAL
  Filled 2024-04-12 (×4): qty 1

## 2024-04-12 MED ORDER — ONDANSETRON HCL 4 MG/2ML IJ SOLN
INTRAMUSCULAR | Status: DC | PRN
  Administered 2024-04-12: 4 mg via INTRAVENOUS

## 2024-04-12 MED ORDER — LIDOCAINE 2% (20 MG/ML) 5 ML SYRINGE
INTRAMUSCULAR | Status: DC | PRN
  Administered 2024-04-12: 50 mg via INTRAVENOUS

## 2024-04-12 MED ORDER — LIDOCAINE 5 % EX PTCH
1.0000 | MEDICATED_PATCH | CUTANEOUS | Status: DC
  Administered 2024-04-12 – 2024-04-13 (×2): 1 via TRANSDERMAL
  Filled 2024-04-12 (×2): qty 1

## 2024-04-12 MED ORDER — SODIUM CHLORIDE 3 % IN NEBU
4.0000 mL | INHALATION_SOLUTION | Freq: Three times a day (TID) | RESPIRATORY_TRACT | Status: DC
  Administered 2024-04-12 – 2024-04-14 (×6): 4 mL via RESPIRATORY_TRACT
  Filled 2024-04-12 (×8): qty 4

## 2024-04-12 MED ORDER — SODIUM CHLORIDE 0.9 % IV SOLN: INTRAVENOUS | Status: DC

## 2024-04-12 MED ORDER — DEXAMETHASONE SODIUM PHOSPHATE 10 MG/ML IJ SOLN
INTRAMUSCULAR | Status: DC | PRN
  Administered 2024-04-12: 5 mg via INTRAVENOUS

## 2024-04-12 NOTE — Plan of Care (Signed)

## 2024-04-12 NOTE — Progress Notes (Signed)
 PROGRESS NOTE  JREAM BROYLES  FMW:[id number] DOB: 08-18-1956 DOA: 04/11/2024 PCP: Baird Comer [name], NP   Brief Narrative: Patient is a 68 year old female with history of COPD, tobacco abuse, hypertension, hyperlipidemia, pernicious anemia who presented with complaint of right-sided chest pain, nausea, vomiting.  Quit smoking about a month ago after 30-pack-year history.  Chest pain was stabbing in nature.  On presentation, she was hemodynamically stable, saturating fine on room air.  Lab work showed WC count of 26.7, sodium of 127.  CT chest was negative for PE, aortic dissection but showed complete collapse of right middle lobe with luminal narrowing of the right middle lobe bronchus with suspected mucus impaction versus endobronchial lesion.  Patient was started on ceftriaxone , azithromycin .  PCCM consulted for possible need of bronchoscopy.   Assessment & Plan:  Principal Problem:   Acute respiratory failure with hypoxia (HCC) Active Problems:   Obstructive pneumonia   Hyponatremia   Atypical chest pain   Intractable vomiting  Right middle lobe opacity/collapse/right-sided chest pain: CT chest as above.  Suspected obstructive pneumonia versus mucus impaction versus endobronchial lesion.  PCCM consulted. Plan for  bronchoscopy.  Continue current antibiotics for now.  She is saturating fine on room air.  Continue bronchodilators as needed. Respiratory viral panel showed rhinovirus/enterovirus. Right-sided chest pain persistent this morning.  Continue pain medication.  Will add lidocaine  patch.  Unclear etiology for right-sided chest pain but could be associated with right middle lung collapse versus endobronchial lesion.  Intractable nausea and vomiting: Etiology unclear.  Started on PPI.  CT abdomen/pelvis negative for bowel obstruction or inflammation.  Showed mild periportal edema, trace pericholecystic fluid.  LFTs normal.  Lipase level normal.  Started on gentle IV fluid.  Right upper  quadrant ultrasound showed  trace amount of pericholecystic fluid . Continue PPI for now.  Elevated leukocytes: Could be associated with pneumonia.  Mild elevated lactic acid level on presentation.  Procalcitonin elevated.  Continue current antibiotic.  Atypical chest pain: Troponins unremarkable.  Echocardiogram ordered.  Hyperlipidemia:Continue statin  COPD/past smoker: Continue bronchodilators as needed.  Currently on room air.  30-pack-year history of smoking.  Recently quit. She needs  lung cancer screening.  Hyponatremia: Improved with IV fluid           DVT prophylaxis:heparin  injection 5,000 Units Start: 04/11/24 2200     Code Status: Full Code  Family Communication: None at the bed side  Patient status:Inpatient  Patient is from :Home  Anticipated discharge un:Ynfz  Estimated DC date:1-2 days   Consultants: PCCM  Procedures:None yet  Antimicrobials:  Anti-infectives (From admission, onward)    Start     Dose/Rate Route Frequency Ordered Stop   04/12/24 1000  azithromycin  (ZITHROMAX ) 500 mg in sodium chloride  0.9 % 250 mL IVPB        500 mg 250 mL/hr over 60 Minutes Intravenous Every 24 hours 04/12/24 0139     04/12/24 1000  cefTRIAXone  (ROCEPHIN ) 2 g in sodium chloride  0.9 % 100 mL IVPB        2 g 200 mL/hr over 30 Minutes Intravenous Every 24 hours 04/12/24 0139     04/11/24 1415  azithromycin  (ZITHROMAX ) 500 mg in sodium chloride  0.9 % 250 mL IVPB        500 mg 250 mL/hr over 60 Minutes Intravenous  Once 04/11/24 1404 04/11/24 1555   04/11/24 1300  cefTRIAXone  (ROCEPHIN ) 2 g in sodium chloride  0.9 % 100 mL IVPB        2 g  200 mL/hr over 30 Minutes Intravenous  Once 04/11/24 1259 04/11/24 1401       Subjective: Patient seen and examined at the bedside today.  She was lying in bed.  She was on room air.  Does not look tachypneic.  Denies any shortness of breath but complains of right-sided chest pain.  No wheezing or rhonchi heard on auscultation.   Denies nausea or vomiting during my evaluation.  She is anticipating bronchoscopy yesterday.  Objective: Vitals:   04/11/24 1639 04/11/24 2151 04/12/24 0017 04/12/24 0437  BP: (!) 123/54 119/61 126/66 118/66  Pulse: 66 74 73 70  Resp:  20 20 20   Temp: 98 F (36.7 C) 98 F (36.7 C) 97.9 F (36.6 C) 98.2 F (36.8 C)  TempSrc:      SpO2: 98% 93% 95% 93%  Weight:      Height:        Intake/Output Summary (Last 24 hours) at 04/12/2024 0735 Last data filed at 04/12/2024 0516 Gross per 24 hour  Intake 574.99 ml  Output --  Net 574.99 ml   Filed Weights   04/11/24 1023  Weight: 53.5 kg    Examination:  General exam: Overall comfortable, not in distress HEENT: PERRL Respiratory system:  no wheezes or crackles, mildly diminished air sounds on the right side Cardiovascular system: S1 & S2 heard, RRR.  Gastrointestinal system: Abdomen is nondistended, soft and nontender. Central nervous system: Alert and oriented Extremities: No edema, no clubbing ,no cyanosis Skin: No rashes, no ulcers,no icterus     Data Reviewed: I have personally reviewed following labs and imaging studies  CBC: Recent Labs  Lab 04/11/24 1035 04/12/24 0218  WBC 26.7* 19.3*  NEUTROABS 24.0*  --   HGB 12.2 10.1*  HCT 36.4 29.9*  MCV 84.3 84.0  PLT 343 275   Basic Metabolic Panel: Recent Labs  Lab 04/11/24 1035 04/12/24 0218  NA 127* 132*  K 3.5 3.9  CL 91* 101  CO2 21* 23  GLUCOSE 105* 99  BUN 15 12  CREATININE 0.75 0.57  CALCIUM 8.5* 8.1*  MG  --  1.7     Recent Results (from the past 240 hours)  Culture, blood (Routine X 2) w Reflex to ID Panel     Status: None (Preliminary result)   Collection Time: 04/11/24  3:17 PM   Specimen: BLOOD  Result Value Ref Range Status   Specimen Description BLOOD BLOOD LEFT ARM  Final   Special Requests   Final    BOTTLES DRAWN AEROBIC AND ANAEROBIC Blood Culture adequate volume   Culture   Final    NO GROWTH < 24 HOURS Performed at [facility], [address] [place], [place] [address]    Report Status PENDING  Incomplete  Respiratory (~20 pathogens) panel by PCR     Status: Abnormal   Collection Time: 04/11/24  3:37 PM   Specimen: Nasopharyngeal Swab; Respiratory  Result Value Ref Range Status   Adenovirus NOT DETECTED NOT DETECTED Final   Coronavirus 229E NOT DETECTED NOT DETECTED Final    Comment: (NOTE) The Coronavirus on the Respiratory Panel, DOES NOT test for the novel  Coronavirus (2019 nCoV)    Coronavirus HKU1 NOT DETECTED NOT DETECTED Final   Coronavirus NL63 NOT DETECTED NOT DETECTED Final   Coronavirus OC43 NOT DETECTED NOT DETECTED Final   Metapneumovirus NOT DETECTED NOT DETECTED Final   Rhinovirus / Enterovirus DETECTED (A) NOT DETECTED Final   Influenza A NOT DETECTED NOT DETECTED Final  Influenza B NOT DETECTED NOT DETECTED Final   Parainfluenza Virus 1 NOT DETECTED NOT DETECTED Final   Parainfluenza Virus 2 NOT DETECTED NOT DETECTED Final   Parainfluenza Virus 3 NOT DETECTED NOT DETECTED Final   Parainfluenza Virus 4 NOT DETECTED NOT DETECTED Final   Respiratory Syncytial Virus NOT DETECTED NOT DETECTED Final   Bordetella pertussis NOT DETECTED NOT DETECTED Final   Bordetella Parapertussis NOT DETECTED NOT DETECTED Final   Chlamydophila pneumoniae NOT DETECTED NOT DETECTED Final   Mycoplasma pneumoniae NOT DETECTED NOT DETECTED Final    Comment: Performed at [facility], [address] [place], [place] [address]  Resp panel by RT-PCR (RSV, Flu A&B, Covid) Anterior Nasal Swab     Status: None   Collection Time: 04/11/24  3:37 PM   Specimen: Anterior Nasal Swab  Result Value Ref Range Status   SARS Coronavirus 2 by RT PCR NEGATIVE NEGATIVE Final    Comment: (NOTE) SARS-CoV-2 target nucleic acids are NOT DETECTED.  The SARS-CoV-2 RNA is generally detectable in upper respiratory specimens during the acute phase of infection. The lowest concentration of SARS-CoV-2 viral copies this assay can  detect is 138 copies/mL. A negative result does not preclude SARS-Cov-2 infection and should not be used as the sole basis for treatment or other patient management decisions. A negative result may occur with  improper specimen collection/handling, submission of specimen other than nasopharyngeal swab, presence of viral mutation(s) within the areas targeted by this assay, and inadequate number of viral copies(<138 copies/mL). A negative result must be combined with clinical observations, patient history, and epidemiological information. The expected result is Negative.  Fact Sheet for Patients:  BloggerCourse.com  Fact Sheet for Healthcare Providers:  SeriousBroker.it  This test is no t yet approved or cleared by the [place]  FDA and  has been authorized for detection and/or diagnosis of SARS-CoV-2 by FDA under an Emergency Use Authorization (EUA). This EUA will remain  in effect (meaning this test can be used) for the duration of the COVID-19 declaration under Section 564(b)(1) of the Act, 21 U.S.C.section 360bbb-3(b)(1), unless the authorization is terminated  or revoked sooner.       Influenza A by PCR NEGATIVE NEGATIVE Final   Influenza B by PCR NEGATIVE NEGATIVE Final    Comment: (NOTE) The Xpert Xpress SARS-CoV-2/FLU/RSV plus assay is intended as an aid in the diagnosis of influenza from Nasopharyngeal swab specimens and should not be used as a sole basis for treatment. Nasal washings and aspirates are unacceptable for Xpert Xpress SARS-CoV-2/FLU/RSV testing.  Fact Sheet for Patients: BloggerCourse.com  Fact Sheet for Healthcare Providers: SeriousBroker.it  This test is not yet approved or cleared by the [place]  FDA and has been authorized for detection and/or diagnosis of SARS-CoV-2 by FDA under an Emergency Use Authorization (EUA). This EUA will remain in  effect (meaning this test can be used) for the duration of the COVID-19 declaration under Section 564(b)(1) of the Act, 21 U.S.C. section 360bbb-3(b)(1), unless the authorization is terminated or revoked.     Resp Syncytial Virus by PCR NEGATIVE NEGATIVE Final    Comment: (NOTE) Fact Sheet for Patients: BloggerCourse.com  Fact Sheet for Healthcare Providers: SeriousBroker.it  This test is not yet approved or cleared by the [place]  FDA and has been authorized for detection and/or diagnosis of SARS-CoV-2 by FDA under an Emergency Use Authorization (EUA). This EUA will remain in effect (meaning this test can be used) for the duration of the COVID-19 declaration under  Section 564(b)(1) of the Act, 21 U.S.C. section 360bbb-3(b)(1), unless the authorization is terminated or revoked.  Performed at [facility], [address] [place], [place] [address]   MRSA Next Gen by PCR, Nasal     Status: None   Collection Time: 04/11/24 10:52 PM   Specimen: Nasal Mucosa; Nasal Swab  Result Value Ref Range Status   MRSA by PCR Next Gen NOT DETECTED NOT DETECTED Final    Comment: (NOTE) The GeneXpert MRSA Assay (FDA approved for NASAL specimens only), is one component of a comprehensive MRSA colonization surveillance program. It is not intended to diagnose MRSA infection nor to guide or monitor treatment for MRSA infections. Test performance is not FDA approved in patients less than 3 years old. Performed at [facility], [address] [place], [place] [address]      Radiology Studies: US  Abdomen Limited RUQ (LIVER/GB) Result Date: 04/11/2024 CLINICAL DATA:  Evaluate thickening of gallbladder wall with pericholecystic fluid. EXAM: ULTRASOUND ABDOMEN LIMITED RIGHT UPPER QUADRANT COMPARISON:  March 14, 2013 FINDINGS: Gallbladder: No gallstones or wall thickening visualized (2.3 mm). A trace amount of pericholecystic fluid is seen. No  sonographic Sem sign noted by sonographer. Common bile duct: Diameter: 1.5 mm Liver: No focal lesion identified. Diffusely increased echogenicity of the renal parenchyma is noted. Portal vein is patent on color Doppler imaging with normal direction of blood flow towards the liver. Other: Of incidental note is the presence of a right-sided pleural effusion. IMPRESSION: 1. Trace amount of pericholecystic fluid. 2. Right pleural effusion. Electronically Signed   By: Suzen Dials M.D.   On: 04/11/2024 20:10   CT Angio Chest PE W and/or Wo Contrast Result Date: 04/11/2024 CLINICAL DATA:  Chest abdominal pain.  Nausea and vomiting. EXAM: CT ANGIOGRAPHY CHEST CT ABDOMEN AND PELVIS WITH CONTRAST TECHNIQUE: Multidetector CT imaging of the chest was performed using the standard protocol during bolus administration of intravenous contrast. Multiplanar CT image reconstructions and MIPs were obtained to evaluate the vascular anatomy. Multidetector CT imaging of the abdomen and pelvis was performed using the standard protocol during bolus administration of intravenous contrast. RADIATION DOSE REDUCTION: This exam was performed according to the departmental dose-optimization program which includes automated exposure control, adjustment of the mA and/or kV according to patient size and/or use of iterative reconstruction technique. CONTRAST:  75mL OMNIPAQUE  IOHEXOL  350 MG/ML SOLN COMPARISON:  Chest radiograph dated 04/11/2024. FINDINGS: Evaluation of this exam is limited due to respiratory motion. CTA CHEST FINDINGS Cardiovascular: There is no cardiomegaly or pericardial effusion. Mild atherosclerotic calcification of the thoracic aorta. No aneurysm dilatation or dissection. The origins of the great vessels of the aortic arch are patent. No pulmonary artery embolus identified. Mediastinum/Nodes: No hilar or mediastinal adenopathy. The esophagus is grossly unremarkable. No mediastinal fluid collection. Lungs/Pleura: There  is complete collapse of the right middle lobe. There is a luminal narrowing of the right middle lobe bronchus which may be due to mucous impaction. An endobronchial lesion is not excluded. Clinical correlation recommended bronchoscopy may provide better evaluation. There is background of emphysema. Small right pleural effusion. No pneumothorax. Musculoskeletal: Osteopenia with degenerative changes of the spine. No acute osseous pathology. Review of the MIP images confirms the above findings. CT ABDOMEN and PELVIS FINDINGS No intra-abdominal free air.  Small free fluid in the pelvis. Hepatobiliary: The liver is unremarkable. There is mild dilatation versus mild periportal edema. The gallbladder is unremarkable. There is a small pericholecystic fluid. Pancreas: Unremarkable. No pancreatic ductal dilatation or  surrounding inflammatory changes. Spleen: Normal in size without focal abnormality. Adrenals/Urinary Tract: The adrenal glands unremarkable. Small right renal interpolar cyst. There is no hydronephrosis on either side. There is symmetric enhancement and excretion of contrast by both kidneys. The visualized ureters and urinary bladder appear unremarkable. Stomach/Bowel: There is postsurgical changes of the bowel with anastomotic staple line in the right lower abdomen. There is no bowel obstruction or active inflammation. Appendectomy. Vascular/Lymphatic: Moderate aortoiliac atherosclerotic disease. The IVC is unremarkable. No portal venous gas. There is no adenopathy. Reproductive: Hysterectomy.  No suspicious adnexal masses. Other: None Musculoskeletal: Osteopenia with degenerative changes of the spine. No acute osseous pathology. Review of the MIP images confirms the above findings. IMPRESSION: 1. No CT evidence of pulmonary artery embolus. 2. Complete collapse of the right middle lobe with luminal narrowing of the right middle lobe bronchus which may be due to mucous impaction. An endobronchial lesion is not  excluded. Bronchoscopy may provide better evaluation. 3. Small right pleural effusion. 4. Mild periportal edema and trace pericholecystic fluid. Correlation with LFTs recommended. 5. No bowel obstruction. 6. Aortic Atherosclerosis (ICD10-I70.0) and Emphysema (ICD10-J43.9). Electronically Signed   By: Vanetta Chou M.D.   On: 04/11/2024 12:46   CT ABDOMEN PELVIS W CONTRAST Result Date: 04/11/2024 CLINICAL DATA:  Chest abdominal pain.  Nausea and vomiting. EXAM: CT ANGIOGRAPHY CHEST CT ABDOMEN AND PELVIS WITH CONTRAST TECHNIQUE: Multidetector CT imaging of the chest was performed using the standard protocol during bolus administration of intravenous contrast. Multiplanar CT image reconstructions and MIPs were obtained to evaluate the vascular anatomy. Multidetector CT imaging of the abdomen and pelvis was performed using the standard protocol during bolus administration of intravenous contrast. RADIATION DOSE REDUCTION: This exam was performed according to the departmental dose-optimization program which includes automated exposure control, adjustment of the mA and/or kV according to patient size and/or use of iterative reconstruction technique. CONTRAST:  75mL OMNIPAQUE  IOHEXOL  350 MG/ML SOLN COMPARISON:  Chest radiograph dated 04/11/2024. FINDINGS: Evaluation of this exam is limited due to respiratory motion. CTA CHEST FINDINGS Cardiovascular: There is no cardiomegaly or pericardial effusion. Mild atherosclerotic calcification of the thoracic aorta. No aneurysm dilatation or dissection. The origins of the great vessels of the aortic arch are patent. No pulmonary artery embolus identified. Mediastinum/Nodes: No hilar or mediastinal adenopathy. The esophagus is grossly unremarkable. No mediastinal fluid collection. Lungs/Pleura: There is complete collapse of the right middle lobe. There is a luminal narrowing of the right middle lobe bronchus which may be due to mucous impaction. An endobronchial lesion is not  excluded. Clinical correlation recommended bronchoscopy may provide better evaluation. There is background of emphysema. Small right pleural effusion. No pneumothorax. Musculoskeletal: Osteopenia with degenerative changes of the spine. No acute osseous pathology. Review of the MIP images confirms the above findings. CT ABDOMEN and PELVIS FINDINGS No intra-abdominal free air.  Small free fluid in the pelvis. Hepatobiliary: The liver is unremarkable. There is mild dilatation versus mild periportal edema. The gallbladder is unremarkable. There is a small pericholecystic fluid. Pancreas: Unremarkable. No pancreatic ductal dilatation or surrounding inflammatory changes. Spleen: Normal in size without focal abnormality. Adrenals/Urinary Tract: The adrenal glands unremarkable. Small right renal interpolar cyst. There is no hydronephrosis on either side. There is symmetric enhancement and excretion of contrast by both kidneys. The visualized ureters and urinary bladder appear unremarkable. Stomach/Bowel: There is postsurgical changes of the bowel with anastomotic staple line in the right lower abdomen. There is no bowel obstruction or active inflammation. Appendectomy. Vascular/Lymphatic: Moderate aortoiliac  atherosclerotic disease. The IVC is unremarkable. No portal venous gas. There is no adenopathy. Reproductive: Hysterectomy.  No suspicious adnexal masses. Other: None Musculoskeletal: Osteopenia with degenerative changes of the spine. No acute osseous pathology. Review of the MIP images confirms the above findings. IMPRESSION: 1. No CT evidence of pulmonary artery embolus. 2. Complete collapse of the right middle lobe with luminal narrowing of the right middle lobe bronchus which may be due to mucous impaction. An endobronchial lesion is not excluded. Bronchoscopy may provide better evaluation. 3. Small right pleural effusion. 4. Mild periportal edema and trace pericholecystic fluid. Correlation with LFTs recommended.  5. No bowel obstruction. 6. Aortic Atherosclerosis (ICD10-I70.0) and Emphysema (ICD10-J43.9). Electronically Signed   By: Vanetta Chou M.D.   On: 04/11/2024 12:46   DG Chest Port 1 View Result Date: 04/11/2024 CLINICAL DATA:  Right-sided chest pain and tenderness.  Recent fall. EXAM: PORTABLE CHEST 1 VIEW COMPARISON:  07/18/2013 FINDINGS: The heart size and mediastinal contours are within normal limits. Both lungs are clear. No pneumothorax or hemothorax visualized. The visualized skeletal structures are unremarkable. IMPRESSION: No active disease. Electronically Signed   By: Norleen DELENA Kil M.D.   On: 04/11/2024 11:11    Scheduled Meds:  heparin   5,000 Units Subcutaneous Q8H   linaclotide   145 mcg Oral QAC breakfast   ondansetron  (ZOFRAN ) IV  4 mg Intravenous Q6H   pantoprazole  (PROTONIX ) IV  40 mg Intravenous Q12H   simvastatin   40 mg Oral q1800   Continuous Infusions:  0.9 % NaCl with KCl 20 mEq / L 75 mL/hr at 04/12/24 0507   azithromycin  (ZITHROMAX ) 500 mg in sodium chloride  0.9 % 250 mL IVPB     cefTRIAXone  (ROCEPHIN )  IV       LOS: 1 day   Ivonne Mustache, MD [facility] P7/24/2025, 7:35 AM

## 2024-04-12 NOTE — Anesthesia Procedure Notes (Signed)
 Procedure Name: Intubation Date/Time: 04/12/2024 1:04 PM  Performed by: Lamar Lucie DASEN, CRNAPre-anesthesia Checklist: Patient identified, Emergency Drugs available, Suction available and Patient being monitored Patient Re-evaluated:Patient Re-evaluated prior to induction Oxygen Delivery Method: Circle system utilized Preoxygenation: Pre-oxygenation with 100% oxygen Induction Type: IV induction Ventilation: Mask ventilation without difficulty Laryngoscope Size: Mac and 4 Grade View: Grade I Tube type: Oral Tube size: 8.5 mm Number of attempts: 1 Airway Equipment and Method: Stylet and Oral airway Placement Confirmation: ETT inserted through vocal cords under direct vision, positive ETCO2 and breath sounds checked- equal and bilateral Secured at: 22 cm Tube secured with: Tape Dental Injury: Teeth and Oropharynx as per pre-operative assessment

## 2024-04-12 NOTE — Anesthesia Preprocedure Evaluation (Signed)
 Anesthesia Evaluation  Patient identified by MRN, date of birth, ID band Patient awake    Reviewed: Allergy & Precautions, NPO status , Patient's Chart, lab work & pertinent test results  History of Anesthesia Complications Negative for: history of anesthetic complications  Airway Mallampati: II  TM Distance: >3 FB Neck ROM: Full    Dental  (+) Dental Advisory Given, Edentulous Upper, Edentulous Lower   Pulmonary shortness of breath, pneumonia, unresolved, neg COPD, former smoker   breath sounds clear to auscultation       Cardiovascular negative cardio ROS  Rhythm:Regular     Neuro/Psych negative neurological ROS  negative psych ROS   GI/Hepatic negative GI ROS, Neg liver ROS,,,  Endo/Other  negative endocrine ROS    Renal/GU negative Renal ROS     Musculoskeletal negative musculoskeletal ROS (+)    Abdominal   Peds  Hematology negative hematology ROS (+)   Anesthesia Other Findings   Reproductive/Obstetrics                              Anesthesia Physical Anesthesia Plan  ASA: 2  Anesthesia Plan: General   Post-op Pain Management: Minimal or no pain anticipated   Induction: Intravenous  PONV Risk Score and Plan: 3 and Ondansetron , Dexamethasone  and Propofol  infusion  Airway Management Planned: Oral ETT  Additional Equipment: None  Intra-op Plan:   Post-operative Plan: Extubation in OR  Informed Consent: I have reviewed the patients History and Physical, chart, labs and discussed the procedure including the risks, benefits and alternatives for the proposed anesthesia with the patient or authorized representative who has indicated his/her understanding and acceptance.     Dental advisory given  Plan Discussed with: CRNA  Anesthesia Plan Comments:         Anesthesia Quick Evaluation

## 2024-04-12 NOTE — Op Note (Signed)
 Video Bronchoscopy Procedure Note  Date of Operation: 04/12/2024  Pre-op Diagnosis: RML collapse, abnormal CT, rhinovirus  Post-op Diagnosis: Same  Surgeon: Leita SHAUNNA Gaskins  Assistants: none  Anesthesia: general anesthesia  Meds Given: per anesthesia MAR  Operation: Flexible video fiberoptic bronchoscopy and BAL.  Estimated Blood Loss: 0 cc  Complications: none noted  Indications and History: ZENORA KARPEL is 68 y.o. with history of tobacco abuse, CT with collapsed RML and occluded RML bronchus.  Recommendation was to perform video fiberoptic bronchoscopy with biopsies. The risks, benefits, complications, treatment options and expected outcomes were discussed with the patient.  The possibilities of pneumothorax, pneumonia, reaction to medication, pulmonary aspiration, perforation of a viscus, bleeding, failure to diagnose a condition and creating a complication requiring transfusion or operation were discussed with the patient who freely signed the consent.    Description of Procedure: The patient was seen in the Preoperative Area, was examined and was deemed appropriate to proceed.  The patient was taken to endoscopy, identified as Devere JINNY Chancy and the procedure verified as Flexible Video Fiberoptic Bronchoscopy.  A Time Out was held and the above information confirmed.   General anesthesia was initiated as indicated above. The video fiberoptic bronchoscope was introduced via the L nare and a general inspection was performed which showed normal distal trachea, normal main carina. Thick secretions in both left and right mainstem bronchi. The L sided airways were inspected and showed normal LUL, lingula, LLL once clear thick mucus was suctioned. The R side was then inspected. The RUL, BI, RML, and RLL were all normal once the mucus was suctioned.  BAL RML performed   BI:   RML:        Samples: 1. BAL for cell count with diff & culture  Plans:  Follow culture results. Con't  empiric ceftriaxone  and azithromycin . CPT with flutter & hypertonic saline ordered.  Husband and patient updated post-procedure.  Leita SHAUNNA Gaskins, DO 04/12/24 1:23 PM [facility]  For contact information, see Amion. If no response to pager, please call PCCM consult pager. After hours, 7PM- 7AM, please call Elink.

## 2024-04-12 NOTE — Progress Notes (Signed)
*  PRELIMINARY RESULTS* Echocardiogram 2D Echocardiogram has been performed.  Kristy Myers 04/12/2024, 3:16 PM

## 2024-04-12 NOTE — Progress Notes (Signed)
-  Adding hypertonic saline and flutter valve post-bronchoscopy to help prevent additional mucus plugging.  -Follow cultures and adjust antibiotics as able. -Recommend referral for lung cancer screening at discharge and consideration for PFTs as an OP based on emphysema on CT scan.    PCCM will be available as needed. Please call with questions.   Leita SHAUNNA Gaskins, DO 04/12/24 3:24 PM [facility]  For contact information, see Amion. If no response to pager, please call PCCM consult pager. After hours, 7PM- 7AM, please call Elink.

## 2024-04-12 NOTE — Consult Note (Signed)
 NAME:  Kristy Myers, MRN:  [id number], DOB:  [birth year], LOS: 1 ADMISSION DATE:  04/11/2024, CONSULTATION DATE: 7/24 REFERRING MD:  [place], CHIEF COMPLAINT: Abnormal chest CT  History of Present Illness:  Kristy Myers is a 68 year old woman with a history of tobacco abuse and osteoporosis who presented to the hospital yesterday after 1 day of constant sharp right sided lower lateral chest pain.  She initially attributed his pain to gas but it did not improve with antacids.  The pain is worse with coughing but not much change with breathing.  It is improved with pain medications.  She has been coughing more for the last few days with discolored sputum.  She has had chills and sweats but no fever.  No rhinorrhea or upper respiratory symptoms.  She has had associated vomiting for the last 2 days.  She has had some wheezing for the last few months.  She is not on inhalers at home.  No history of lung disease.  In the emergency department she had an abdominal CT that was most impressive for collapsed right middle lobe of her lung and small right-sided pleural effusion.  She quit smoking on June 20 after 40 years of smoking 1 pack/day.  In the emergency department she was found to have leukocytosis.  She was started on azithromycin  and ceftriaxone  empirically for pneumonia.  Due to nausea she has only had a few sips of water today and has not had any solid food in 2 days.  She is not on blood thinners or antiplatelet medications.  PCCM was consulted for evaluation of her RML collapse.   Pertinent  Medical History  Tobacco abuse Osteoporosis GERD  Significant Hospital Events: Including procedures, antibiotic start and stop dates in addition to other pertinent events   7/23 admitted, started ceftriaxone  azithromycin   Interim History / Subjective:    Objective    Blood pressure 118/66, pulse 70, temperature 98.2 F (36.8 C), resp. rate 20, height 5' 5 (1.651 m), weight 53.5 kg, SpO2 93%.         Intake/Output Summary (Last 24 hours) at 04/12/2024 0853 Last data filed at 04/12/2024 0516 Gross per 24 hour  Intake 574.99 ml  Output --  Net 574.99 ml   Filed Weights   04/11/24 1023  Weight: 53.5 kg    Examination: General: Elderly woman sitting up in bed no respiratory distress.  Dry heaving during exam. HENT: [place]/AT, eyes anicteric Lungs: Breathing comfortably on room air, no conversational dyspnea.  Rhonchi posteriorly on the right, faint lateral rales Cardiovascular: S1-S2, regular rate and rhythm Abdomen: Nondistended Extremities: No clubbing Neuro: Awake and alert, answering questions appropriately Lymph: No supraclavicular cervical adenopathy  CT chest personally reviewed-emphysema, airway thickening, collapse right middle lobe with partial occlusion of bronchus.  No significant mediastinal adenopathy.  Resolved problem list   Assessment and Plan   Cough, right-sided chest pain suspicious for pneumonia with leukocytosis and associated chills, sweats, vomiting.  Worry about possible endobronchial obstructing mass that is associated.  Her tobacco abuse and emphysema is concerning. -Recommend bronchoscopy for airway inspection, possible endobronchial biopsy.  We discussed risks, benefits, alternatives.  She agrees.  Planning for this afternoon. - Agree with empiric antibiotics-azithromycin  and ceftriaxone   History of tobacco abuse - Recommend referral to lung cancer screening as an outpatient though no concerning findings on her bronchoscopy today. -Encouraged ongoing cessation and congratulated her for quitting.  Best Practice (right click and Reselect all SmartList Selections daily)   Per primary  Labs   CBC: Recent Labs  Lab 04/11/24 1035 04/12/24 0218  WBC 26.7* 19.3*  NEUTROABS 24.0*  --   HGB 12.2 10.1*  HCT 36.4 29.9*  MCV 84.3 84.0  PLT 343 275    Basic Metabolic Panel: Recent Labs  Lab 04/11/24 1035 04/12/24 0218  NA 127* 132*  K 3.5 3.9   CL 91* 101  CO2 21* 23  GLUCOSE 105* 99  BUN 15 12  CREATININE 0.75 0.57  CALCIUM 8.5* 8.1*  MG  --  1.7   GFR: Estimated Creatinine Clearance: 56.1 mL/min (by C-G formula based on SCr of 0.57 mg/dL). Recent Labs  Lab 04/11/24 1035 04/11/24 1517 04/12/24 0218  PROCALCITON  --  5.47  --   WBC 26.7*  --  19.3*  LATICACIDVEN  --  2.3*  --     Liver Function Tests: Recent Labs  Lab 04/11/24 1035 04/12/24 0218  AST 20 13*  ALT 13 12  ALKPHOS 58 46  BILITOT 1.1 0.7  PROT 7.3 5.6*  ALBUMIN 3.6 2.6*   Recent Labs  Lab 04/11/24 1517  LIPASE 25   No results for input(s): AMMONIA in the last 168 hours.  ABG    Component Value Date/Time   TCO2 26 04/30/2011 1512     Coagulation Profile: No results for input(s): INR, PROTIME in the last 168 hours.  Cardiac Enzymes: No results for input(s): CKTOTAL, CKMB, CKMBINDEX, TROPONINI in the last 168 hours.  HbA1C: No results found for: HGBA1C  CBG: No results for input(s): GLUCAP in the last 168 hours.  Review of Systems:   Review of Systems  Constitutional:  Positive for chills. Negative for fever.  HENT:  Negative for congestion.   Respiratory:  Positive for cough, sputum production and wheezing. Negative for shortness of breath.   Cardiovascular:  Positive for chest pain. Negative for leg swelling.  Gastrointestinal:  Positive for nausea and vomiting.  Skin:  Negative for rash.     Past Medical History:  She,  has a past medical history of History of nuclear stress test (06/11/2011) and Syncope.   Surgical History:   Past Surgical History:  Procedure Laterality Date   APPENDECTOMY     COLON SURGERY  06/2009   COLONOSCOPY WITH PROPOFOL  N/A 09/18/2021   Procedure: COLONOSCOPY WITH PROPOFOL ;  Surgeon: Cindie Carlin POUR, DO;  Location: [facility];  Service: Endoscopy;  Laterality: N/A;  12:30 / ASA II   LEFT HEART CATHETERIZATION WITH CORONARY ANGIOGRAM N/A 07/25/2013   Procedure: LEFT  HEART CATHETERIZATION WITH CORONARY ANGIOGRAM;  Surgeon: Vinie KYM Maxcy, MD;  Location: [facility]ATH [facility];  Service: Cardiovascular;  Laterality: N/A;   PARTIAL HYSTERECTOMY     POLYPECTOMY  09/18/2021   Procedure: POLYPECTOMY;  Surgeon: Cindie Carlin POUR, DO;  Location: [facility];  Service: Endoscopy;;     Social History:   reports that she quit smoking about 2 years ago. Her smoking use included cigarettes. She started smoking about 35 years ago. She has a 33 pack-year smoking history. She has never used smokeless tobacco. She reports that she does not drink alcohol and does not use drugs.   Family History:  Her family history includes Cancer in her father; Heart Problems in her maternal grandmother; Other in her mother.   Allergies Allergies  Allergen Reactions   Bee Venom Anaphylaxis   Fire Ant Anaphylaxis and Hives   Wasp Venom Anaphylaxis, Hives, Shortness Of Breath and Swelling   Codeine Itching  Home Medications  Prior to Admission medications   Medication Sig Start Date End Date Taking? Authorizing Provider  alendronate (FOSAMAX) 70 MG tablet Take 70 mg by mouth once a week.   Yes [provider]  EPINEPHrine  0.3 mg/0.3 mL IJ SOAJ injection Inject 0.3 mg into the muscle as needed for anaphylaxis. 03/02/24  Yes Midge Golas, MD  ergocalciferol (VITAMIN D2) 1.25 MG (50000 UT) capsule Take 50,000 Units by mouth once a week.   Yes [provider]  ibuprofen (ADVIL,MOTRIN) 800 MG tablet Take 800 mg by mouth every 8 (eight) hours as needed for moderate pain or headache.   Yes [provider]  LINZESS  145 MCG CAPS capsule Take 145 mcg by mouth daily as needed for constipation. 08/19/21  Yes [provider]  Multiple Vitamins-Minerals (MULTIVITAMIN GUMMIES ADULTS PO) Take 4 tablets by mouth daily.   Yes [provider]  omeprazole (PRILOSEC) 20 MG capsule Take 20 mg by mouth 2 (two) times daily as needed (heartburn). 06/27/13  Yes  [provider]  rizatriptan (MAXALT) 10 MG tablet Take 10 mg by mouth as needed for migraine. 08/30/21  Yes [provider]  simethicone (MYLICON) 80 MG chewable tablet Chew 80 mg by mouth 3 (three) times daily as needed for flatulence.   Yes [provider]  simvastatin  (ZOCOR ) 40 MG tablet Take 40 mg by mouth at bedtime.   Yes [provider]  zolpidem  (AMBIEN ) 10 MG tablet Take 10 mg by mouth at bedtime as needed for sleep.   Yes [provider]     Critical care time: n/a       Leita SHAUNNA Gaskins, DO 04/12/24 8:53 AM [facility]  For contact information, see Amion. If no response to pager, please call PCCM consult pager. After hours, 7PM- 7AM, please call Elink.

## 2024-04-12 NOTE — Transfer of Care (Signed)
 Immediate Anesthesia Transfer of Care Note  Patient: Kristy Myers  Procedure(s) Performed: VIDEO BRONCHOSCOPY WITHOUT FLUORO  Patient Location: PACU  Anesthesia Type:General  Level of Consciousness: awake, alert , and oriented  Airway & Oxygen Therapy: Patient Spontanous Breathing and Patient connected to face mask oxygen  Post-op Assessment: Report given to RN and Post -op Vital signs reviewed and stable  Post vital signs: Reviewed and stable  Last Vitals:  Vitals Value Taken Time  BP    Temp    Pulse    Resp    SpO2      Last Pain:  Vitals:   04/12/24 1056  TempSrc: Temporal  PainSc: 8          Complications: No notable events documented.

## 2024-04-13 DIAGNOSIS — J9601 Acute respiratory failure with hypoxia: Secondary | ICD-10-CM | POA: Diagnosis not present

## 2024-04-13 LAB — BASIC METABOLIC PANEL WITH GFR
Anion gap: 10 (ref 5–15)
BUN: 14 mg/dL (ref 8–23)
CO2: 21 mmol/L — ABNORMAL LOW (ref 22–32)
Calcium: 8.2 mg/dL — ABNORMAL LOW (ref 8.9–10.3)
Chloride: 102 mmol/L (ref 98–111)
Creatinine, Ser: 0.63 mg/dL (ref 0.44–1.00)
GFR, Estimated: >60 mL/min (ref >60)
Glucose, Bld: 105 mg/dL — ABNORMAL HIGH (ref 70–99)
Potassium: 3.8 mmol/L (ref 3.5–5.1)
Sodium: 133 mmol/L — ABNORMAL LOW (ref 135–145)

## 2024-04-13 LAB — CBC
HCT: 28.7 % — ABNORMAL LOW (ref 36.0–46.0)
Hemoglobin: 9.4 g/dL — ABNORMAL LOW (ref 12.0–15.0)
MCH: 27.9 pg (ref 26.0–34.0)
MCHC: 32.8 g/dL (ref 30.0–36.0)
MCV: 85.2 fL (ref 80.0–100.0)
Platelets: 313 K/uL (ref 150–400)
RBC: 3.37 MIL/uL — ABNORMAL LOW (ref 3.87–5.11)
RDW: 14.6 % (ref 11.5–15.5)
WBC: 7.6 K/uL (ref 4.0–10.5)
nRBC: 0 % (ref 0.0–0.2)

## 2024-04-13 MED ORDER — ONDANSETRON 4 MG PO TBDP
4.0000 mg | ORAL_TABLET | Freq: Three times a day (TID) | ORAL | Status: DC | PRN
  Administered 2024-04-13 – 2024-04-14 (×2): 4 mg via ORAL
  Filled 2024-04-13 (×2): qty 1

## 2024-04-13 MED ORDER — SODIUM CHLORIDE 0.9 % IV SOLN: 12.5000 mg | Freq: Four times a day (QID) | INTRAVENOUS | Status: DC | PRN

## 2024-04-13 MED ORDER — PROMETHAZINE HCL 12.5 MG PO TABS
25.0000 mg | ORAL_TABLET | Freq: Four times a day (QID) | ORAL | Status: DC | PRN
  Administered 2024-04-13: 25 mg via ORAL
  Filled 2024-04-13: qty 2

## 2024-04-13 NOTE — Anesthesia Postprocedure Evaluation (Signed)
 Anesthesia Post Note  Patient: Kristy Myers  Procedure(s) Performed: VIDEO BRONCHOSCOPY WITHOUT FLUORO     Patient location during evaluation: PACU Anesthesia Type: General Level of consciousness: awake and alert Pain management: pain level controlled Vital Signs Assessment: post-procedure vital signs reviewed and stable Respiratory status: spontaneous breathing, nonlabored ventilation and respiratory function stable Cardiovascular status: blood pressure returned to baseline Postop Assessment: no apparent nausea or vomiting Anesthetic complications: no   No notable events documented.            Vertell Row

## 2024-04-13 NOTE — Progress Notes (Signed)
 Transition of Care [facility]) - Inpatient Brief Assessment   Patient Details  Name: Kristy Myers MRN: [id number] Date of Birth: 03/16/1956  [facility]) CM/SW Contact:    Rosaline JONELLE Joe, RN Phone Number: 04/13/2024, 11:30 AM   Clinical Narrative: CM noted that patient admitted to the hospital with ARF and pulmonary edema.  Patient is post- video bronchoscopy yesterday.  Patient remains on RA.  Notes state that patient quit smoking 1 month ago.  No IP Care management needs at this time. Please place [facility] consult if needs arise while patient progresses.   Transition of Care Asessment: Insurance and Status: (P) Insurance coverage has been reviewed Patient has primary care physician: (P) Yes Home environment has been reviewed: (P) from home Prior level of function:: (P) self Prior/Current Home Services: (P) No current home services Social Drivers of Health Review: (P) SDOH reviewed no interventions necessary Readmission risk has been reviewed: (P) Yes Transition of care needs: (P) no transition of care needs at this time

## 2024-04-13 NOTE — Plan of Care (Signed)

## 2024-04-13 NOTE — Progress Notes (Signed)
 PROGRESS NOTE  Kristy Myers  FMW:[id number] DOB: [birth year] DOA: 04/11/2024 PCP: Baird Comer [name], NP   Brief Narrative: Patient is a 68 year old female with history of COPD, tobacco abuse, hypertension, hyperlipidemia, pernicious anemia who presented with complaint of right-sided chest pain, nausea, vomiting.  Quit smoking about a month ago after 30-pack-year history.  Chest pain was stabbing in nature.  On presentation, she was hemodynamically stable, saturating fine on room air.  Lab work showed WC count of 26.7, sodium of 127.  CT chest was negative for PE, aortic dissection but showed complete collapse of right middle lobe with luminal narrowing of the right middle lobe bronchus with suspected mucus impaction versus endobronchial lesion.  Patient was started on ceftriaxone , azithromycin .  PCCM consulted for bronchoscopy.   Assessment & Plan:  Principal Problem:   Acute respiratory failure with hypoxia (HCC) Active Problems:   Obstructive pneumonia   Hyponatremia   Atypical chest pain   Intractable vomiting   Abnormal CT of the chest   History of tobacco abuse   Pneumonia of right middle lobe due to infectious organism  Right middle lobe opacity/collapse/right-sided chest pain: CT chest as above.  Suspected obstructive pneumonia versus mucus impaction versus endobronchial lesion.  PCCM consulted.  Underwent bronchoscopy, found to have mucous plug.  continue current antibiotics for now.  She is saturating fine on room air.  Continue bronchodilators as needed. Respiratory viral panel showed rhinovirus/enterovirus. Added hypertonic saline and flutter valve plus colonoscopy to help prevent additional mucous plugging.  BAL did not show any organisms.  Follow-up culture report  Intractable nausea and vomiting: Etiology unclear.  Started on PPI.  CT abdomen/pelvis negative for bowel obstruction or inflammation.  Showed mild periportal edema, trace pericholecystic fluid.  LFTs normal.   Lipase level normal.  Given gentle iv fluids.  Right upper quadrant ultrasound showed  trace amount of pericholecystic fluid . Continue PPI for now.  Nausea/vomiting better today  Elevated leukocytes: Could be associated with pneumonia.  Mild elevated lactic acid level on presentation.  Procalcitonin elevated.  Continue current antibiotic.  Leukocytosis resolved  Atypical chest pain: Troponins unremarkable.  Echocardiogram showed EF of 60 to 60%, grade 1 diastolic dysfunction, no regional wall motion abnormalities. Right-sided chest pain improved but not entirely resolved .continue lidocaine  patch.  Unclear etiology for right-sided chest pain but could be associated with right middle lung collapse.  No rib fractures or any other acute findings on the CT imaging  Hyperlipidemia:Continue statin  COPD/past smoker: Continue bronchodilators as needed.  Currently on room air.  30-pack-year history of smoking.  Recently quit. She needs  lung cancer screening, and consideration of PFTs as an outpatient for findings of emphysema on chest CT  Hyponatremia: Improved with IV fluid           DVT prophylaxis:heparin  injection 5,000 Units Start: 04/11/24 2200     Code Status: Full Code  Family Communication: None at the bed side  Patient status:Inpatient  Patient is from :Home  Anticipated discharge un:Ynfz  Estimated DC date:1-2 days   Consultants: PCCM  Procedures:None yet  Antimicrobials:  Anti-infectives (From admission, onward)    Start     Dose/Rate Route Frequency Ordered Stop   04/12/24 1000  azithromycin  (ZITHROMAX ) 500 mg in sodium chloride  0.9 % 250 mL IVPB        500 mg 250 mL/hr over 60 Minutes Intravenous Every 24 hours 04/12/24 0139     04/12/24 1000  cefTRIAXone  (ROCEPHIN ) 2 g in sodium chloride  0.9 %  100 mL IVPB        2 g 200 mL/hr over 30 Minutes Intravenous Every 24 hours 04/12/24 0139     04/11/24 1415  azithromycin  (ZITHROMAX ) 500 mg in sodium chloride  0.9 %  250 mL IVPB        500 mg 250 mL/hr over 60 Minutes Intravenous  Once 04/11/24 1404 04/11/24 1555   04/11/24 1300  cefTRIAXone  (ROCEPHIN ) 2 g in sodium chloride  0.9 % 100 mL IVPB        2 g 200 mL/hr over 30 Minutes Intravenous  Once 04/11/24 1259 04/11/24 1401       Subjective: Patient seen and examined at bedside today.  Hemodynamically stable.  Overall comfortable, lying in bed.  He still has some right-sided chest discomfort on the lateral rib side but she feels more comfortable than yesterday.  Still has some nausea but no vomiting this morning.  Has been on room air.  Does not appear to be tachypneic or having cough.   Objective: Vitals:   04/13/24 0134 04/13/24 0511 04/13/24 0810 04/13/24 0837  BP: (!) 140/61 139/74 (!) 151/73   Pulse: 63 65 63   Resp:  17 20   Temp: 98.7 F (37.1 C) 98 F (36.7 C) 98.5 F (36.9 C)   TempSrc:   Oral   SpO2: 94% 94% 90% 91%  Weight:      Height:        Intake/Output Summary (Last 24 hours) at 04/13/2024 1036 Last data filed at 04/13/2024 0447 Gross per 24 hour  Intake 1832.78 ml  Output 0 ml  Net 1832.78 ml   Filed Weights   04/11/24 1023 04/12/24 1056  Weight: 53.5 kg 53.5 kg    Examination:   General exam: Overall comfortable, not in distress HEENT: PERRL Respiratory system:  no wheezes or crackles  Cardiovascular system: S1 & S2 heard, RRR.  Gastrointestinal system: Abdomen is nondistended, soft and nontender. Central nervous system: Alert and oriented Extremities: No edema, no clubbing ,no cyanosis Skin: No rashes, no ulcers,no icterus     Data Reviewed: I have personally reviewed following labs and imaging studies  CBC: Recent Labs  Lab 04/11/24 1035 04/12/24 0218 04/13/24 0206  WBC 26.7* 19.3* 7.6  NEUTROABS 24.0*  --   --   HGB 12.2 10.1* 9.4*  HCT 36.4 29.9* 28.7*  MCV 84.3 84.0 85.2  PLT 343 275 313   Basic Metabolic Panel: Recent Labs  Lab 04/11/24 1035 04/12/24 0218 04/13/24 0206  NA 127* 132*  133*  K 3.5 3.9 3.8  CL 91* 101 102  CO2 21* 23 21*  GLUCOSE 105* 99 105*  BUN 15 12 14   CREATININE 0.75 0.57 0.63  CALCIUM 8.5* 8.1* 8.2*  MG  --  1.7  --      Recent Results (from the past 240 hours)  Culture, blood (Routine X 2) w Reflex to ID Panel     Status: None (Preliminary result)   Collection Time: 04/11/24  3:17 PM   Specimen: BLOOD  Result Value Ref Range Status   Specimen Description BLOOD BLOOD LEFT ARM  Final   Special Requests   Final    BOTTLES DRAWN AEROBIC AND ANAEROBIC Blood Culture adequate volume   Culture   Final    NO GROWTH 2 DAYS Performed at [facility], [address], [place], [place] [address]    Report Status PENDING  Incomplete  Respiratory (~20 pathogens) panel by PCR     Status: Abnormal  Collection Time: 04/11/24  3:37 PM   Specimen: Nasopharyngeal Swab; Respiratory  Result Value Ref Range Status   Adenovirus NOT DETECTED NOT DETECTED Final   Coronavirus 229E NOT DETECTED NOT DETECTED Final    Comment: (NOTE) The Coronavirus on the Respiratory Panel, DOES NOT test for the novel  Coronavirus (2019 nCoV)    Coronavirus HKU1 NOT DETECTED NOT DETECTED Final   Coronavirus NL63 NOT DETECTED NOT DETECTED Final   Coronavirus OC43 NOT DETECTED NOT DETECTED Final   Metapneumovirus NOT DETECTED NOT DETECTED Final   Rhinovirus / Enterovirus DETECTED (A) NOT DETECTED Final   Influenza A NOT DETECTED NOT DETECTED Final   Influenza B NOT DETECTED NOT DETECTED Final   Parainfluenza Virus 1 NOT DETECTED NOT DETECTED Final   Parainfluenza Virus 2 NOT DETECTED NOT DETECTED Final   Parainfluenza Virus 3 NOT DETECTED NOT DETECTED Final   Parainfluenza Virus 4 NOT DETECTED NOT DETECTED Final   Respiratory Syncytial Virus NOT DETECTED NOT DETECTED Final   Bordetella pertussis NOT DETECTED NOT DETECTED Final   Bordetella Parapertussis NOT DETECTED NOT DETECTED Final   Chlamydophila pneumoniae NOT DETECTED NOT DETECTED Final   Mycoplasma pneumoniae NOT  DETECTED NOT DETECTED Final    Comment: Performed at [facility], [address], [place], [place] [address]  Resp panel by RT-PCR (RSV, Flu A&B, Covid) Anterior Nasal Swab     Status: None   Collection Time: 04/11/24  3:37 PM   Specimen: Anterior Nasal Swab  Result Value Ref Range Status   SARS Coronavirus 2 by RT PCR NEGATIVE NEGATIVE Final    Comment: (NOTE) SARS-CoV-2 target nucleic acids are NOT DETECTED.  The SARS-CoV-2 RNA is generally detectable in upper respiratory specimens during the acute phase of infection. The lowest concentration of SARS-CoV-2 viral copies this assay can detect is 138 copies/mL. A negative result does not preclude SARS-Cov-2 infection and should not be used as the sole basis for treatment or other patient management decisions. A negative result may occur with  improper specimen collection/handling, submission of specimen other than nasopharyngeal swab, presence of viral mutation(s) within the areas targeted by this assay, and inadequate number of viral copies(<138 copies/mL). A negative result must be combined with clinical observations, patient history, and epidemiological information. The expected result is Negative.  Fact Sheet for Patients:  BloggerCourse.com  Fact Sheet for Healthcare Providers:  SeriousBroker.it  This test is no t yet approved or cleared by the [place]  FDA and  has been authorized for detection and/or diagnosis of SARS-CoV-2 by FDA under an Emergency Use Authorization (EUA). This EUA will remain  in effect (meaning this test can be used) for the duration of the COVID-19 declaration under Section 564(b)(1) of the Act, 21 U.S.C.section 360bbb-3(b)(1), unless the authorization is terminated  or revoked sooner.       Influenza A by PCR NEGATIVE NEGATIVE Final   Influenza B by PCR NEGATIVE NEGATIVE Final    Comment: (NOTE) The Xpert Xpress SARS-CoV-2/FLU/RSV plus  assay is intended as an aid in the diagnosis of influenza from Nasopharyngeal swab specimens and should not be used as a sole basis for treatment. Nasal washings and aspirates are unacceptable for Xpert Xpress SARS-CoV-2/FLU/RSV testing.  Fact Sheet for Patients: BloggerCourse.com  Fact Sheet for Healthcare Providers: SeriousBroker.it  This test is not yet approved or cleared by the [place]  FDA and has been authorized for detection and/or diagnosis of SARS-CoV-2 by FDA under an Emergency Use Authorization (EUA). This EUA will remain  in effect (meaning this test can be used) for the duration of the COVID-19 declaration under Section 564(b)(1) of the Act, 21 U.S.C. section 360bbb-3(b)(1), unless the authorization is terminated or revoked.     Resp Syncytial Virus by PCR NEGATIVE NEGATIVE Final    Comment: (NOTE) Fact Sheet for Patients: BloggerCourse.com  Fact Sheet for Healthcare Providers: SeriousBroker.it  This test is not yet approved or cleared by the [place]  FDA and has been authorized for detection and/or diagnosis of SARS-CoV-2 by FDA under an Emergency Use Authorization (EUA). This EUA will remain in effect (meaning this test can be used) for the duration of the COVID-19 declaration under Section 564(b)(1) of the Act, 21 U.S.C. section 360bbb-3(b)(1), unless the authorization is terminated or revoked.  Performed at [facility], [address] [place], [place] [address]   MRSA Next Gen by PCR, Nasal     Status: None   Collection Time: 04/11/24 10:52 PM   Specimen: Nasal Mucosa; Nasal Swab  Result Value Ref Range Status   MRSA by PCR Next Gen NOT DETECTED NOT DETECTED Final    Comment: (NOTE) The GeneXpert MRSA Assay (FDA approved for NASAL specimens only), is one component of a comprehensive MRSA colonization surveillance program. It is not intended to  diagnose MRSA infection nor to guide or monitor treatment for MRSA infections. Test performance is not FDA approved in patients less than 63 years old. Performed at [facility], [address] [place], [place] [address]   Culture, Respiratory w Gram Stain     Status: None (Preliminary result)   Collection Time: 04/12/24  1:13 PM   Specimen: Bronchial Alveolar Lavage; Respiratory  Result Value Ref Range Status   Specimen Description BRONCHIAL ALVEOLAR LAVAGE  Final   Special Requests NONE  Final   Gram Stain   Final    FEW WBC PRESENT, PREDOMINANTLY PMN NO ORGANISMS SEEN Performed at [facility], [address], [place], [place] [address]    Culture PENDING  Incomplete   Report Status PENDING  Incomplete     Radiology Studies: ECHOCARDIOGRAM COMPLETE Result Date: 04/12/2024    ECHOCARDIOGRAM REPORT   Patient Name:   Kristy Myers Date of Exam: 04/12/2024 Medical Rec #:  [id number]      Height:       65.0 in Accession #:    [id number]     Weight:       118.0 lb Date of Birth:  1956-01-19       BSA:          1.581 m Patient Age:    68 years       BP:           118/66 mmHg Patient Gender: F              HR:           70 bpm. Exam Location:  Inpatient Procedure: 2D Echo, Cardiac Doppler and Color Doppler (Both Spectral and Color            Flow Doppler were utilized during procedure). Indications:    Chest pain  History:        Patient has no prior history of Echocardiogram examinations.  Sonographer:    Benard Stallion Referring Phys: [phone] DAVID TAT IMPRESSIONS  1. Left ventricular ejection fraction, by estimation, is 60 to 65%. The left ventricle has normal function. The left ventricle has no regional wall motion abnormalities. Left ventricular diastolic parameters are consistent  with Grade I diastolic dysfunction (impaired relaxation).  2. Right ventricular systolic function is normal. The right ventricular size is normal. There is moderately elevated pulmonary artery systolic  pressure.  3. Left atrial size was mildly dilated.  4. The mitral valve is normal in structure. Mild mitral valve regurgitation. No evidence of mitral stenosis.  5. The aortic valve is tricuspid. Aortic valve regurgitation is not visualized. No aortic stenosis is present.  6. The inferior vena cava is dilated in size with <50% respiratory variability, suggesting right atrial pressure of 15 mmHg. FINDINGS  Left Ventricle: Left ventricular ejection fraction, by estimation, is 60 to 65%. The left ventricle has normal function. The left ventricle has no regional wall motion abnormalities. The left ventricular internal cavity size was normal in size. There is  no left ventricular hypertrophy. Left ventricular diastolic parameters are consistent with Grade I diastolic dysfunction (impaired relaxation). Right Ventricle: The right ventricular size is normal. No increase in right ventricular wall thickness. Right ventricular systolic function is normal. There is moderately elevated pulmonary artery systolic pressure. The tricuspid regurgitant velocity is 3.22 m/s, and with an assumed right atrial pressure of 15 mmHg, the estimated right ventricular systolic pressure is 56.5 mmHg. Left Atrium: Left atrial size was mildly dilated. Right Atrium: Right atrial size was normal in size. Pericardium: There is no evidence of pericardial effusion. Mitral Valve: The mitral valve is normal in structure. Mild mitral valve regurgitation. No evidence of mitral valve stenosis. Tricuspid Valve: The tricuspid valve is normal in structure. Tricuspid valve regurgitation is mild . No evidence of tricuspid stenosis. Aortic Valve: The aortic valve is tricuspid. Aortic valve regurgitation is not visualized. No aortic stenosis is present. Aortic valve mean gradient measures 5.0 mmHg. Aortic valve peak gradient measures 9.7 mmHg. Aortic valve area, by VTI measures 2.43 cm. Pulmonic Valve: The pulmonic valve was normal in structure. Pulmonic valve  regurgitation is not visualized. No evidence of pulmonic stenosis. Aorta: The aortic root is normal in size and structure. Venous: The inferior vena cava is dilated in size with less than 50% respiratory variability, suggesting right atrial pressure of 15 mmHg. IAS/Shunts: No atrial level shunt detected by color flow Doppler.  LEFT VENTRICLE PLAX 2D LVIDd:         4.90 cm   Diastology LVIDs:         3.40 cm   LV e' medial:    7.51 cm/s LV PW:         0.80 cm   LV E/e' medial:  12.1 LV IVS:        0.70 cm   LV e' lateral:   11.40 cm/s LVOT diam:     2.10 cm   LV E/e' lateral: 8.0 LV SV:         72 LV SV Index:   45 LVOT Area:     3.46 cm  RIGHT VENTRICLE RV Basal diam:  3.50 cm RV Mid diam:    2.80 cm RV S prime:     15.90 cm/s TAPSE (M-mode): 3.5 cm LEFT ATRIUM             Index        RIGHT ATRIUM           Index LA diam:        3.40 cm 2.15 cm/m   RA Area:     12.80 cm LA Vol (A2C):   65.7 ml 41.56 ml/m  RA Volume:   28.50 ml  18.03  ml/m LA Vol (A4C):   57.2 ml 36.19 ml/m LA Biplane Vol: 62.5 ml 39.54 ml/m  AORTIC VALVE AV Area (Vmax):    2.40 cm AV Area (Vmean):   2.29 cm AV Area (VTI):     2.43 cm AV Vmax:           156.00 cm/s AV Vmean:          105.500 cm/s AV VTI:            0.295 m AV Peak Grad:      9.7 mmHg AV Mean Grad:      5.0 mmHg LVOT Vmax:         108.00 cm/s LVOT Vmean:        69.700 cm/s LVOT VTI:          0.207 m LVOT/AV VTI ratio: 0.70  AORTA Ao Root diam: 3.00 cm MITRAL VALVE               TRICUSPID VALVE MV Area (PHT): 4.21 cm    TR Peak grad:   41.5 mmHg MV Decel Time: 180 msec    TR Vmax:        322.00 cm/s MR Peak grad: 127.7 mmHg MR Vmax:      565.00 cm/s  SHUNTS MV E velocity: 90.70 cm/s  Systemic VTI:  0.21 m MV A velocity: 87.30 cm/s  Systemic Diam: 2.10 cm MV E/A ratio:  1.04 Morene Brownie Electronically signed by Morene Brownie Signature Date/Time: 04/12/2024/3:21:34 PM    Final    US  Abdomen Limited RUQ (LIVER/GB) Result Date: 04/11/2024 CLINICAL DATA:  Evaluate  thickening of gallbladder wall with pericholecystic fluid. EXAM: ULTRASOUND ABDOMEN LIMITED RIGHT UPPER QUADRANT COMPARISON:  March 14, 2013 FINDINGS: Gallbladder: No gallstones or wall thickening visualized (2.3 mm). A trace amount of pericholecystic fluid is seen. No sonographic Menger sign noted by sonographer. Common bile duct: Diameter: 1.5 mm Liver: No focal lesion identified. Diffusely increased echogenicity of the renal parenchyma is noted. Portal vein is patent on color Doppler imaging with normal direction of blood flow towards the liver. Other: Of incidental note is the presence of a right-sided pleural effusion. IMPRESSION: 1. Trace amount of pericholecystic fluid. 2. Right pleural effusion. Electronically Signed   By: Suzen Dials M.D.   On: 04/11/2024 20:10   CT Angio Chest PE W and/or Wo Contrast Result Date: 04/11/2024 CLINICAL DATA:  Chest abdominal pain.  Nausea and vomiting. EXAM: CT ANGIOGRAPHY CHEST CT ABDOMEN AND PELVIS WITH CONTRAST TECHNIQUE: Multidetector CT imaging of the chest was performed using the standard protocol during bolus administration of intravenous contrast. Multiplanar CT image reconstructions and MIPs were obtained to evaluate the vascular anatomy. Multidetector CT imaging of the abdomen and pelvis was performed using the standard protocol during bolus administration of intravenous contrast. RADIATION DOSE REDUCTION: This exam was performed according to the departmental dose-optimization program which includes automated exposure control, adjustment of the mA and/or kV according to patient size and/or use of iterative reconstruction technique. CONTRAST:  75mL OMNIPAQUE  IOHEXOL  350 MG/ML SOLN COMPARISON:  Chest radiograph dated 04/11/2024. FINDINGS: Evaluation of this exam is limited due to respiratory motion. CTA CHEST FINDINGS Cardiovascular: There is no cardiomegaly or pericardial effusion. Mild atherosclerotic calcification of the thoracic aorta. No aneurysm  dilatation or dissection. The origins of the great vessels of the aortic arch are patent. No pulmonary artery embolus identified. Mediastinum/Nodes: No hilar or mediastinal adenopathy. The esophagus is grossly unremarkable. No mediastinal fluid collection. Lungs/Pleura: There is complete  collapse of the right middle lobe. There is a luminal narrowing of the right middle lobe bronchus which may be due to mucous impaction. An endobronchial lesion is not excluded. Clinical correlation recommended bronchoscopy may provide better evaluation. There is background of emphysema. Small right pleural effusion. No pneumothorax. Musculoskeletal: Osteopenia with degenerative changes of the spine. No acute osseous pathology. Review of the MIP images confirms the above findings. CT ABDOMEN and PELVIS FINDINGS No intra-abdominal free air.  Small free fluid in the pelvis. Hepatobiliary: The liver is unremarkable. There is mild dilatation versus mild periportal edema. The gallbladder is unremarkable. There is a small pericholecystic fluid. Pancreas: Unremarkable. No pancreatic ductal dilatation or surrounding inflammatory changes. Spleen: Normal in size without focal abnormality. Adrenals/Urinary Tract: The adrenal glands unremarkable. Small right renal interpolar cyst. There is no hydronephrosis on either side. There is symmetric enhancement and excretion of contrast by both kidneys. The visualized ureters and urinary bladder appear unremarkable. Stomach/Bowel: There is postsurgical changes of the bowel with anastomotic staple line in the right lower abdomen. There is no bowel obstruction or active inflammation. Appendectomy. Vascular/Lymphatic: Moderate aortoiliac atherosclerotic disease. The IVC is unremarkable. No portal venous gas. There is no adenopathy. Reproductive: Hysterectomy.  No suspicious adnexal masses. Other: None Musculoskeletal: Osteopenia with degenerative changes of the spine. No acute osseous pathology. Review of  the MIP images confirms the above findings. IMPRESSION: 1. No CT evidence of pulmonary artery embolus. 2. Complete collapse of the right middle lobe with luminal narrowing of the right middle lobe bronchus which may be due to mucous impaction. An endobronchial lesion is not excluded. Bronchoscopy may provide better evaluation. 3. Small right pleural effusion. 4. Mild periportal edema and trace pericholecystic fluid. Correlation with LFTs recommended. 5. No bowel obstruction. 6. Aortic Atherosclerosis (ICD10-I70.0) and Emphysema (ICD10-J43.9). Electronically Signed   By: Vanetta Chou M.D.   On: 04/11/2024 12:46   CT ABDOMEN PELVIS W CONTRAST Result Date: 04/11/2024 CLINICAL DATA:  Chest abdominal pain.  Nausea and vomiting. EXAM: CT ANGIOGRAPHY CHEST CT ABDOMEN AND PELVIS WITH CONTRAST TECHNIQUE: Multidetector CT imaging of the chest was performed using the standard protocol during bolus administration of intravenous contrast. Multiplanar CT image reconstructions and MIPs were obtained to evaluate the vascular anatomy. Multidetector CT imaging of the abdomen and pelvis was performed using the standard protocol during bolus administration of intravenous contrast. RADIATION DOSE REDUCTION: This exam was performed according to the departmental dose-optimization program which includes automated exposure control, adjustment of the mA and/or kV according to patient size and/or use of iterative reconstruction technique. CONTRAST:  75mL OMNIPAQUE  IOHEXOL  350 MG/ML SOLN COMPARISON:  Chest radiograph dated 04/11/2024. FINDINGS: Evaluation of this exam is limited due to respiratory motion. CTA CHEST FINDINGS Cardiovascular: There is no cardiomegaly or pericardial effusion. Mild atherosclerotic calcification of the thoracic aorta. No aneurysm dilatation or dissection. The origins of the great vessels of the aortic arch are patent. No pulmonary artery embolus identified. Mediastinum/Nodes: No hilar or mediastinal  adenopathy. The esophagus is grossly unremarkable. No mediastinal fluid collection. Lungs/Pleura: There is complete collapse of the right middle lobe. There is a luminal narrowing of the right middle lobe bronchus which may be due to mucous impaction. An endobronchial lesion is not excluded. Clinical correlation recommended bronchoscopy may provide better evaluation. There is background of emphysema. Small right pleural effusion. No pneumothorax. Musculoskeletal: Osteopenia with degenerative changes of the spine. No acute osseous pathology. Review of the MIP images confirms the above findings. CT ABDOMEN and PELVIS FINDINGS No  intra-abdominal free air.  Small free fluid in the pelvis. Hepatobiliary: The liver is unremarkable. There is mild dilatation versus mild periportal edema. The gallbladder is unremarkable. There is a small pericholecystic fluid. Pancreas: Unremarkable. No pancreatic ductal dilatation or surrounding inflammatory changes. Spleen: Normal in size without focal abnormality. Adrenals/Urinary Tract: The adrenal glands unremarkable. Small right renal interpolar cyst. There is no hydronephrosis on either side. There is symmetric enhancement and excretion of contrast by both kidneys. The visualized ureters and urinary bladder appear unremarkable. Stomach/Bowel: There is postsurgical changes of the bowel with anastomotic staple line in the right lower abdomen. There is no bowel obstruction or active inflammation. Appendectomy. Vascular/Lymphatic: Moderate aortoiliac atherosclerotic disease. The IVC is unremarkable. No portal venous gas. There is no adenopathy. Reproductive: Hysterectomy.  No suspicious adnexal masses. Other: None Musculoskeletal: Osteopenia with degenerative changes of the spine. No acute osseous pathology. Review of the MIP images confirms the above findings. IMPRESSION: 1. No CT evidence of pulmonary artery embolus. 2. Complete collapse of the right middle lobe with luminal narrowing  of the right middle lobe bronchus which may be due to mucous impaction. An endobronchial lesion is not excluded. Bronchoscopy may provide better evaluation. 3. Small right pleural effusion. 4. Mild periportal edema and trace pericholecystic fluid. Correlation with LFTs recommended. 5. No bowel obstruction. 6. Aortic Atherosclerosis (ICD10-I70.0) and Emphysema (ICD10-J43.9). Electronically Signed   By: Vanetta Chou M.D.   On: 04/11/2024 12:46   DG Chest Port 1 View Result Date: 04/11/2024 CLINICAL DATA:  Right-sided chest pain and tenderness.  Recent fall. EXAM: PORTABLE CHEST 1 VIEW COMPARISON:  07/18/2013 FINDINGS: The heart size and mediastinal contours are within normal limits. Both lungs are clear. No pneumothorax or hemothorax visualized. The visualized skeletal structures are unremarkable. IMPRESSION: No active disease. Electronically Signed   By: Norleen DELENA Kil M.D.   On: 04/11/2024 11:11    Scheduled Meds:  heparin   5,000 Units Subcutaneous Q8H   lidocaine   1 patch Transdermal Q24H   linaclotide   145 mcg Oral QAC breakfast   ondansetron  (ZOFRAN ) IV  4 mg Intravenous Q6H   pantoprazole  (PROTONIX ) IV  40 mg Intravenous Q12H   simvastatin   40 mg Oral q1800   sodium chloride  HYPERTONIC  4 mL Nebulization TID   Continuous Infusions:  sodium chloride  75 mL/hr at 04/13/24 0515   azithromycin  (ZITHROMAX ) 500 mg in sodium chloride  0.9 % 250 mL IVPB 250 mL/hr at 04/13/24 0447   cefTRIAXone  (ROCEPHIN )  IV 2 g (04/13/24 1006)     LOS: 2 days   Ivonne Mustache, MD [facility] P7/25/2025, 10:36 AM

## 2024-04-14 ENCOUNTER — Encounter (HOSPITAL_COMMUNITY): Payer: Self-pay | Admitting: Critical Care Medicine

## 2024-04-14 ENCOUNTER — Other Ambulatory Visit (HOSPITAL_COMMUNITY): Payer: Self-pay

## 2024-04-14 DIAGNOSIS — J9601 Acute respiratory failure with hypoxia: Secondary | ICD-10-CM | POA: Diagnosis not present

## 2024-04-14 MED ORDER — ONDANSETRON 4 MG PO TBDP
4.0000 mg | ORAL_TABLET | Freq: Three times a day (TID) | ORAL | 0 refills | Status: AC | PRN
  Filled 2024-04-14: qty 20, 7d supply, fill #0

## 2024-04-14 MED ORDER — LIDOCAINE 4 % EX PTCH
1.0000 | MEDICATED_PATCH | CUTANEOUS | 0 refills | Status: AC
  Filled 2024-04-14: qty 18, 18d supply, fill #0

## 2024-04-14 MED ORDER — AMOXICILLIN-POT CLAVULANATE 875-125 MG PO TABS
1.0000 | ORAL_TABLET | Freq: Two times a day (BID) | ORAL | 0 refills | Status: AC
  Filled 2024-04-14: qty 2, 1d supply, fill #0

## 2024-04-14 NOTE — Discharge Summary (Addendum)
 Physician Discharge Summary  LEN KLUVER FMW:[id number] DOB: 1956-06-22 DOA: 04/11/2024  PCP: Baird Comer [name], NP  Admit date: 04/11/2024 Discharge date: 04/14/2024  Admitted From: Home Disposition:  Home  Discharge Condition:Stable CODE STATUS:FULL Diet recommendation: Heart Healthy  Brief/Interim Summary: Patient is a 68 year old female with history of COPD, tobacco abuse, hypertension, hyperlipidemia, pernicious anemia who presented with complaint of right-sided chest pain, nausea, vomiting. Quit smoking about a month ago after 30-pack-year history. Chest pain was stabbing in nature. On presentation, she was hemodynamically stable, saturating fine on room air. Lab work showed WC count of 26.7, sodium of 127. CT chest was negative for PE, aortic dissection but showed complete collapse of right middle lobe with luminal narrowing of the right middle lobe bronchus with suspected mucus impaction versus endobronchial lesion. Patient was started on ceftriaxone , azithromycin . PCCM consulted for bronchoscopy.  Found to have mucous plug.  Currently significantly improved clinically.  Right-sided chest pain has almost resolved.  She feels ready to go home.  Medically stable for discharge.  Following problems were addressed during the hospitalization:   Right middle lobe opacity/collapse/right-sided chest pain: CT chest as above.  Suspected obstructive pneumonia versus mucus impaction versus endobronchial lesion.  PCCM consulted.  Underwent bronchoscopy, found to have mucous plug. Respiratory viral panel showed rhinovirus/enterovirus. Added hypertonic saline and flutter valve  to help prevent additional mucous plugging.  BAL did not show any organisms. Culture no growth till date. Antibiotics changed to oral.   Intractable nausea and vomiting: Etiology unclear.  Started on PPI.  CT abdomen/pelvis negative for bowel obstruction or inflammation.  Showed mild periportal edema, trace pericholecystic  fluid.  LFTs normal.  Lipase level normal.    Right upper quadrant ultrasound showed  trace amount of pericholecystic fluid .  Nausea/vomiting resolved  Elevated leukocytes: Could be associated with pneumonia.  Mild elevated lactic acid level on presentation.  Procalcitonin elevated.    Leukocytosis resolved   Atypical chest pain: Troponins unremarkable.  Echocardiogram showed EF of 60 to 60%, grade 1 diastolic dysfunction, no regional wall motion abnormalities. Right-sided chest pain improved .continue lidocaine  patch.  Unclear etiology for right-sided chest pain but could be associated with right middle lung collapse.  No rib fractures or any other acute findings on the CT imaging   Hyperlipidemia:Continue statin   COPD/past smoker: Continue bronchodilators as needed.  Currently on room air.  30-pack-year history of smoking.  Recently quit. She needs  lung cancer screening, and consideration of PFTs as an outpatient for findings of emphysema on chest CT   Hyponatremia: Improved with IV fluid       Discharge Diagnoses:  Principal Problem:   Acute respiratory failure with hypoxia (HCC) Active Problems:   Obstructive pneumonia   Hyponatremia   Atypical chest pain   Intractable vomiting   Abnormal CT of the chest   History of tobacco abuse   Pneumonia of right middle lobe due to infectious organism    Discharge Instructions  Discharge Instructions     Diet - low sodium heart healthy   Complete by: As directed    Discharge instructions   Complete by: As directed    1)Please take your medication as instructed 2)Follow up with your PCP in a week 3)Follow up with pulmonology as an outpatient.  Name and number of the provider group has been attached   Increase activity slowly   Complete by: As directed    Pulmonary Visit   Complete by: As directed    Lung  cancer screening,PFT   Reason for referral: Other Pulmonary      Allergies as of 04/14/2024       Reactions   Bee Venom  Anaphylaxis   Fire Ant Anaphylaxis, Hives   Wasp Venom Anaphylaxis, Hives, Shortness Of Breath, Swelling   Codeine Itching        Medication List     TAKE these medications    alendronate 70 MG tablet Commonly known as: FOSAMAX Take 70 mg by mouth once a week.   amoxicillin -clavulanate 875-125 MG tablet Commonly known as: AUGMENTIN  Take 1 tablet by mouth 2 (two) times daily for 1 day. Start taking on: April 15, 2024   EPINEPHrine  0.3 mg/0.3 mL Soaj injection Commonly known as: EPI-PEN Inject 0.3 mg into the muscle as needed for anaphylaxis.   ergocalciferol 1.25 MG (50000 UT) capsule Commonly known as: VITAMIN D2 Take 50,000 Units by mouth once a week.   FT Pain Relief Max Strength 4 % Generic drug: lidocaine  Place 1 patch onto the skin daily. Apply on painful area of right chest   ibuprofen 800 MG tablet Commonly known as: ADVIL Take 800 mg by mouth every 8 (eight) hours as needed for moderate pain or headache.   Linzess  145 MCG Caps capsule Generic drug: linaclotide  Take 145 mcg by mouth daily as needed for constipation.   MULTIVITAMIN GUMMIES ADULTS PO Take 4 tablets by mouth daily.   omeprazole 20 MG capsule Commonly known as: PRILOSEC Take 20 mg by mouth 2 (two) times daily as needed (heartburn).   ondansetron  4 MG disintegrating tablet Commonly known as: ZOFRAN -ODT Take 1 tablet (4 mg total) by mouth every 8 (eight) hours as needed for nausea or vomiting.   rizatriptan 10 MG tablet Commonly known as: MAXALT Take 10 mg by mouth as needed for migraine.   simethicone 80 MG chewable tablet Commonly known as: MYLICON Chew 80 mg by mouth 3 (three) times daily as needed for flatulence.   simvastatin  40 MG tablet Commonly known as: ZOCOR  Take 40 mg by mouth at bedtime.   zolpidem  10 MG tablet Commonly known as: AMBIEN  Take 10 mg by mouth at bedtime as needed for sleep.        Follow-up Information     Hemberg, Comer [name], NP. Schedule an  appointment as soon as possible for a visit in 1 week(s).   Specialty: Adult Health Nurse Practitioner Contact information: [address] [place] [address] [phone]         [facility] at [facility]. Schedule an appointment as soon as possible for a visit in 2 week(s).   Specialty: Pulmonology Contact information: [address] [place] [address] [place]  [address] [phone] Additional information: [address]  [address]  [place], [place] [address]               Allergies  Allergen Reactions   Bee Venom Anaphylaxis   Fire Ant Anaphylaxis and Hives   Wasp Venom Anaphylaxis, Hives, Shortness Of Breath and Swelling   Codeine Itching    Consultations:    Procedures/Studies: ECHOCARDIOGRAM COMPLETE Result Date: 04/12/2024    ECHOCARDIOGRAM REPORT   Patient Name:   TYLIAH SCHLERETH Date of Exam: 04/12/2024 Medical Rec #:  [id number]      Height:       65.0 in Accession #:    [id number]     Weight:       118.0 lb Date of Birth:  09/01/1956  BSA:          1.581 m Patient Age:    68 years       BP:           118/66 mmHg Patient Gender: F              HR:           70 bpm. Exam Location:  Inpatient Procedure: 2D Echo, Cardiac Doppler and Color Doppler (Both Spectral and Color            Flow Doppler were utilized during procedure). Indications:    Chest pain  History:        Patient has no prior history of Echocardiogram examinations.  Sonographer:    Benard Stallion Referring Phys: [id number] DAVID TAT IMPRESSIONS  1. Left ventricular ejection fraction, by estimation, is 60 to 65%. The left ventricle has normal function. The left ventricle has no regional wall motion abnormalities. Left ventricular diastolic parameters are consistent with Grade I diastolic dysfunction (impaired relaxation).  2. Right ventricular systolic function is normal. The right ventricular size is normal. There is moderately elevated pulmonary artery  systolic pressure.  3. Left atrial size was mildly dilated.  4. The mitral valve is normal in structure. Mild mitral valve regurgitation. No evidence of mitral stenosis.  5. The aortic valve is tricuspid. Aortic valve regurgitation is not visualized. No aortic stenosis is present.  6. The inferior vena cava is dilated in size with <50% respiratory variability, suggesting right atrial pressure of 15 mmHg. FINDINGS  Left Ventricle: Left ventricular ejection fraction, by estimation, is 60 to 65%. The left ventricle has normal function. The left ventricle has no regional wall motion abnormalities. The left ventricular internal cavity size was normal in size. There is  no left ventricular hypertrophy. Left ventricular diastolic parameters are consistent with Grade I diastolic dysfunction (impaired relaxation). Right Ventricle: The right ventricular size is normal. No increase in right ventricular wall thickness. Right ventricular systolic function is normal. There is moderately elevated pulmonary artery systolic pressure. The tricuspid regurgitant velocity is 3.22 m/s, and with an assumed right atrial pressure of 15 mmHg, the estimated right ventricular systolic pressure is 56.5 mmHg. Left Atrium: Left atrial size was mildly dilated. Right Atrium: Right atrial size was normal in size. Pericardium: There is no evidence of pericardial effusion. Mitral Valve: The mitral valve is normal in structure. Mild mitral valve regurgitation. No evidence of mitral valve stenosis. Tricuspid Valve: The tricuspid valve is normal in structure. Tricuspid valve regurgitation is mild . No evidence of tricuspid stenosis. Aortic Valve: The aortic valve is tricuspid. Aortic valve regurgitation is not visualized. No aortic stenosis is present. Aortic valve mean gradient measures 5.0 mmHg. Aortic valve peak gradient measures 9.7 mmHg. Aortic valve area, by VTI measures 2.43 cm. Pulmonic Valve: The pulmonic valve was normal in structure. Pulmonic  valve regurgitation is not visualized. No evidence of pulmonic stenosis. Aorta: The aortic root is normal in size and structure. Venous: The inferior vena cava is dilated in size with less than 50% respiratory variability, suggesting right atrial pressure of 15 mmHg. IAS/Shunts: No atrial level shunt detected by color flow Doppler.  LEFT VENTRICLE PLAX 2D LVIDd:         4.90 cm   Diastology LVIDs:         3.40 cm   LV e' medial:    7.51 cm/s LV PW:         0.80 cm   LV  E/e' medial:  12.1 LV IVS:        0.70 cm   LV e' lateral:   11.40 cm/s LVOT diam:     2.10 cm   LV E/e' lateral: 8.0 LV SV:         72 LV SV Index:   45 LVOT Area:     3.46 cm  RIGHT VENTRICLE RV Basal diam:  3.50 cm RV Mid diam:    2.80 cm RV S prime:     15.90 cm/s TAPSE (M-mode): 3.5 cm LEFT ATRIUM             Index        RIGHT ATRIUM           Index LA diam:        3.40 cm 2.15 cm/m   RA Area:     12.80 cm LA Vol (A2C):   65.7 ml 41.56 ml/m  RA Volume:   28.50 ml  18.03 ml/m LA Vol (A4C):   57.2 ml 36.19 ml/m LA Biplane Vol: 62.5 ml 39.54 ml/m  AORTIC VALVE AV Area (Vmax):    2.40 cm AV Area (Vmean):   2.29 cm AV Area (VTI):     2.43 cm AV Vmax:           156.00 cm/s AV Vmean:          105.500 cm/s AV VTI:            0.295 m AV Peak Grad:      9.7 mmHg AV Mean Grad:      5.0 mmHg LVOT Vmax:         108.00 cm/s LVOT Vmean:        69.700 cm/s LVOT VTI:          0.207 m LVOT/AV VTI ratio: 0.70  AORTA Ao Root diam: 3.00 cm MITRAL VALVE               TRICUSPID VALVE MV Area (PHT): 4.21 cm    TR Peak grad:   41.5 mmHg MV Decel Time: 180 msec    TR Vmax:        322.00 cm/s MR Peak grad: 127.7 mmHg MR Vmax:      565.00 cm/s  SHUNTS MV E velocity: 90.70 cm/s  Systemic VTI:  0.21 m MV A velocity: 87.30 cm/s  Systemic Diam: 2.10 cm MV E/A ratio:  1.04 Morene Brownie Electronically signed by Morene Brownie Signature Date/Time: 04/12/2024/3:21:34 PM    Final    US  Abdomen Limited RUQ (LIVER/GB) Result Date: 04/11/2024 CLINICAL DATA:  Evaluate  thickening of gallbladder wall with pericholecystic fluid. EXAM: ULTRASOUND ABDOMEN LIMITED RIGHT UPPER QUADRANT COMPARISON:  March 14, 2013 FINDINGS: Gallbladder: No gallstones or wall thickening visualized (2.3 mm). A trace amount of pericholecystic fluid is seen. No sonographic Piccirilli sign noted by sonographer. Common bile duct: Diameter: 1.5 mm Liver: No focal lesion identified. Diffusely increased echogenicity of the renal parenchyma is noted. Portal vein is patent on color Doppler imaging with normal direction of blood flow towards the liver. Other: Of incidental note is the presence of a right-sided pleural effusion. IMPRESSION: 1. Trace amount of pericholecystic fluid. 2. Right pleural effusion. Electronically Signed   By: Suzen Dials M.D.   On: 04/11/2024 20:10   CT Angio Chest PE W and/or Wo Contrast Result Date: 04/11/2024 CLINICAL DATA:  Chest abdominal pain.  Nausea and vomiting. EXAM: CT ANGIOGRAPHY CHEST CT ABDOMEN AND PELVIS WITH CONTRAST TECHNIQUE: Multidetector  CT imaging of the chest was performed using the standard protocol during bolus administration of intravenous contrast. Multiplanar CT image reconstructions and MIPs were obtained to evaluate the vascular anatomy. Multidetector CT imaging of the abdomen and pelvis was performed using the standard protocol during bolus administration of intravenous contrast. RADIATION DOSE REDUCTION: This exam was performed according to the departmental dose-optimization program which includes automated exposure control, adjustment of the mA and/or kV according to patient size and/or use of iterative reconstruction technique. CONTRAST:  75mL OMNIPAQUE  IOHEXOL  350 MG/ML SOLN COMPARISON:  Chest radiograph dated 04/11/2024. FINDINGS: Evaluation of this exam is limited due to respiratory motion. CTA CHEST FINDINGS Cardiovascular: There is no cardiomegaly or pericardial effusion. Mild atherosclerotic calcification of the thoracic aorta. No aneurysm  dilatation or dissection. The origins of the great vessels of the aortic arch are patent. No pulmonary artery embolus identified. Mediastinum/Nodes: No hilar or mediastinal adenopathy. The esophagus is grossly unremarkable. No mediastinal fluid collection. Lungs/Pleura: There is complete collapse of the right middle lobe. There is a luminal narrowing of the right middle lobe bronchus which may be due to mucous impaction. An endobronchial lesion is not excluded. Clinical correlation recommended bronchoscopy may provide better evaluation. There is background of emphysema. Small right pleural effusion. No pneumothorax. Musculoskeletal: Osteopenia with degenerative changes of the spine. No acute osseous pathology. Review of the MIP images confirms the above findings. CT ABDOMEN and PELVIS FINDINGS No intra-abdominal free air.  Small free fluid in the pelvis. Hepatobiliary: The liver is unremarkable. There is mild dilatation versus mild periportal edema. The gallbladder is unremarkable. There is a small pericholecystic fluid. Pancreas: Unremarkable. No pancreatic ductal dilatation or surrounding inflammatory changes. Spleen: Normal in size without focal abnormality. Adrenals/Urinary Tract: The adrenal glands unremarkable. Small right renal interpolar cyst. There is no hydronephrosis on either side. There is symmetric enhancement and excretion of contrast by both kidneys. The visualized ureters and urinary bladder appear unremarkable. Stomach/Bowel: There is postsurgical changes of the bowel with anastomotic staple line in the right lower abdomen. There is no bowel obstruction or active inflammation. Appendectomy. Vascular/Lymphatic: Moderate aortoiliac atherosclerotic disease. The IVC is unremarkable. No portal venous gas. There is no adenopathy. Reproductive: Hysterectomy.  No suspicious adnexal masses. Other: None Musculoskeletal: Osteopenia with degenerative changes of the spine. No acute osseous pathology. Review of  the MIP images confirms the above findings. IMPRESSION: 1. No CT evidence of pulmonary artery embolus. 2. Complete collapse of the right middle lobe with luminal narrowing of the right middle lobe bronchus which may be due to mucous impaction. An endobronchial lesion is not excluded. Bronchoscopy may provide better evaluation. 3. Small right pleural effusion. 4. Mild periportal edema and trace pericholecystic fluid. Correlation with LFTs recommended. 5. No bowel obstruction. 6. Aortic Atherosclerosis (ICD10-I70.0) and Emphysema (ICD10-J43.9). Electronically Signed   By: Vanetta Chou M.D.   On: 04/11/2024 12:46   CT ABDOMEN PELVIS W CONTRAST Result Date: 04/11/2024 CLINICAL DATA:  Chest abdominal pain.  Nausea and vomiting. EXAM: CT ANGIOGRAPHY CHEST CT ABDOMEN AND PELVIS WITH CONTRAST TECHNIQUE: Multidetector CT imaging of the chest was performed using the standard protocol during bolus administration of intravenous contrast. Multiplanar CT image reconstructions and MIPs were obtained to evaluate the vascular anatomy. Multidetector CT imaging of the abdomen and pelvis was performed using the standard protocol during bolus administration of intravenous contrast. RADIATION DOSE REDUCTION: This exam was performed according to the departmental dose-optimization program which includes automated exposure control, adjustment of the mA and/or kV according to  patient size and/or use of iterative reconstruction technique. CONTRAST:  75mL OMNIPAQUE  IOHEXOL  350 MG/ML SOLN COMPARISON:  Chest radiograph dated 04/11/2024. FINDINGS: Evaluation of this exam is limited due to respiratory motion. CTA CHEST FINDINGS Cardiovascular: There is no cardiomegaly or pericardial effusion. Mild atherosclerotic calcification of the thoracic aorta. No aneurysm dilatation or dissection. The origins of the great vessels of the aortic arch are patent. No pulmonary artery embolus identified. Mediastinum/Nodes: No hilar or mediastinal  adenopathy. The esophagus is grossly unremarkable. No mediastinal fluid collection. Lungs/Pleura: There is complete collapse of the right middle lobe. There is a luminal narrowing of the right middle lobe bronchus which may be due to mucous impaction. An endobronchial lesion is not excluded. Clinical correlation recommended bronchoscopy may provide better evaluation. There is background of emphysema. Small right pleural effusion. No pneumothorax. Musculoskeletal: Osteopenia with degenerative changes of the spine. No acute osseous pathology. Review of the MIP images confirms the above findings. CT ABDOMEN and PELVIS FINDINGS No intra-abdominal free air.  Small free fluid in the pelvis. Hepatobiliary: The liver is unremarkable. There is mild dilatation versus mild periportal edema. The gallbladder is unremarkable. There is a small pericholecystic fluid. Pancreas: Unremarkable. No pancreatic ductal dilatation or surrounding inflammatory changes. Spleen: Normal in size without focal abnormality. Adrenals/Urinary Tract: The adrenal glands unremarkable. Small right renal interpolar cyst. There is no hydronephrosis on either side. There is symmetric enhancement and excretion of contrast by both kidneys. The visualized ureters and urinary bladder appear unremarkable. Stomach/Bowel: There is postsurgical changes of the bowel with anastomotic staple line in the right lower abdomen. There is no bowel obstruction or active inflammation. Appendectomy. Vascular/Lymphatic: Moderate aortoiliac atherosclerotic disease. The IVC is unremarkable. No portal venous gas. There is no adenopathy. Reproductive: Hysterectomy.  No suspicious adnexal masses. Other: None Musculoskeletal: Osteopenia with degenerative changes of the spine. No acute osseous pathology. Review of the MIP images confirms the above findings. IMPRESSION: 1. No CT evidence of pulmonary artery embolus. 2. Complete collapse of the right middle lobe with luminal narrowing  of the right middle lobe bronchus which may be due to mucous impaction. An endobronchial lesion is not excluded. Bronchoscopy may provide better evaluation. 3. Small right pleural effusion. 4. Mild periportal edema and trace pericholecystic fluid. Correlation with LFTs recommended. 5. No bowel obstruction. 6. Aortic Atherosclerosis (ICD10-I70.0) and Emphysema (ICD10-J43.9). Electronically Signed   By: Vanetta Chou M.D.   On: 04/11/2024 12:46   DG Chest Port 1 View Result Date: 04/11/2024 CLINICAL DATA:  Right-sided chest pain and tenderness.  Recent fall. EXAM: PORTABLE CHEST 1 VIEW COMPARISON:  07/18/2013 FINDINGS: The heart size and mediastinal contours are within normal limits. Both lungs are clear. No pneumothorax or hemothorax visualized. The visualized skeletal structures are unremarkable. IMPRESSION: No active disease. Electronically Signed   By: Norleen DELENA Kil M.D.   On: 04/11/2024 11:11      Subjective: Patient seen and examined at bedside today.  Hemodynamically stable.  Comfortable.  No chest pain today.  Nausea and vomiting have resolved.  She feels ready to go home.  Discharge Exam: Vitals:   04/14/24 0440 04/14/24 0757  BP: (!) 149/81 (!) 150/70  Pulse: 62 65  Resp: 18 18  Temp: 98.2 F (36.8 C) 98.3 F (36.8 C)  SpO2: 90% 92%   Vitals:   04/13/24 1927 04/13/24 2342 04/14/24 0440 04/14/24 0757  BP: 136/60 (!) 113/48 (!) 149/81 (!) 150/70  Pulse: 64 69 62 65  Resp: 19 16 18 18   Temp: 98  F (36.7 C) 98.3 F (36.8 C) 98.2 F (36.8 C) 98.3 F (36.8 C)  TempSrc: Oral  Oral Oral  SpO2:  94% 90% 92%  Weight:      Height:        General: Pt is alert, awake, not in acute distress Cardiovascular: RRR, S1/S2 +, no rubs, no gallops Respiratory: CTA bilaterally, no wheezing, no rhonchi Abdominal: Soft, NT, ND, bowel sounds + Extremities: no edema, no cyanosis    The results of significant diagnostics from this hospitalization (including imaging, microbiology,  ancillary and laboratory) are listed below for reference.     Microbiology: Recent Results (from the past 240 hours)  Culture, blood (Routine X 2) w Reflex to ID Panel     Status: None (Preliminary result)   Collection Time: 04/11/24  3:17 PM   Specimen: BLOOD  Result Value Ref Range Status   Specimen Description BLOOD BLOOD LEFT ARM  Final   Special Requests   Final    BOTTLES DRAWN AEROBIC AND ANAEROBIC Blood Culture adequate volume   Culture   Final    NO GROWTH 3 DAYS Performed at [facility], [address] [place], [place] [address]    Report Status PENDING  Incomplete  Respiratory (~20 pathogens) panel by PCR     Status: Abnormal   Collection Time: 04/11/24  3:37 PM   Specimen: Nasopharyngeal Swab; Respiratory  Result Value Ref Range Status   Adenovirus NOT DETECTED NOT DETECTED Final   Coronavirus 229E NOT DETECTED NOT DETECTED Final    Comment: (NOTE) The Coronavirus on the Respiratory Panel, DOES NOT test for the novel  Coronavirus (2019 nCoV)    Coronavirus HKU1 NOT DETECTED NOT DETECTED Final   Coronavirus NL63 NOT DETECTED NOT DETECTED Final   Coronavirus OC43 NOT DETECTED NOT DETECTED Final   Metapneumovirus NOT DETECTED NOT DETECTED Final   Rhinovirus / Enterovirus DETECTED (A) NOT DETECTED Final   Influenza A NOT DETECTED NOT DETECTED Final   Influenza B NOT DETECTED NOT DETECTED Final   Parainfluenza Virus 1 NOT DETECTED NOT DETECTED Final   Parainfluenza Virus 2 NOT DETECTED NOT DETECTED Final   Parainfluenza Virus 3 NOT DETECTED NOT DETECTED Final   Parainfluenza Virus 4 NOT DETECTED NOT DETECTED Final   Respiratory Syncytial Virus NOT DETECTED NOT DETECTED Final   Bordetella pertussis NOT DETECTED NOT DETECTED Final   Bordetella Parapertussis NOT DETECTED NOT DETECTED Final   Chlamydophila pneumoniae NOT DETECTED NOT DETECTED Final   Mycoplasma pneumoniae NOT DETECTED NOT DETECTED Final    Comment: Performed at [facility], [address],  [place], [place] [address]  Resp panel by RT-PCR (RSV, Flu A&B, Covid) Anterior Nasal Swab     Status: None   Collection Time: 04/11/24  3:37 PM   Specimen: Anterior Nasal Swab  Result Value Ref Range Status   SARS Coronavirus 2 by RT PCR NEGATIVE NEGATIVE Final    Comment: (NOTE) SARS-CoV-2 target nucleic acids are NOT DETECTED.  The SARS-CoV-2 RNA is generally detectable in upper respiratory specimens during the acute phase of infection. The lowest concentration of SARS-CoV-2 viral copies this assay can detect is 138 copies/mL. A negative result does not preclude SARS-Cov-2 infection and should not be used as the sole basis for treatment or other patient management decisions. A negative result may occur with  improper specimen collection/handling, submission of specimen other than nasopharyngeal swab, presence of viral mutation(s) within the areas targeted by this assay, and inadequate number of viral copies(<138  copies/mL). A negative result must be combined with clinical observations, patient history, and epidemiological information. The expected result is Negative.  Fact Sheet for Patients:  BloggerCourse.com  Fact Sheet for Healthcare Providers:  SeriousBroker.it  This test is no t yet approved or cleared by the [place]  FDA and  has been authorized for detection and/or diagnosis of SARS-CoV-2 by FDA under an Emergency Use Authorization (EUA). This EUA will remain  in effect (meaning this test can be used) for the duration of the COVID-19 declaration under Section 564(b)(1) of the Act, 21 U.S.C.section 360bbb-3(b)(1), unless the authorization is terminated  or revoked sooner.       Influenza A by PCR NEGATIVE NEGATIVE Final   Influenza B by PCR NEGATIVE NEGATIVE Final    Comment: (NOTE) The Xpert Xpress SARS-CoV-2/FLU/RSV plus assay is intended as an aid in the diagnosis of influenza from Nasopharyngeal swab specimens  and should not be used as a sole basis for treatment. Nasal washings and aspirates are unacceptable for Xpert Xpress SARS-CoV-2/FLU/RSV testing.  Fact Sheet for Patients: BloggerCourse.com  Fact Sheet for Healthcare Providers: SeriousBroker.it  This test is not yet approved or cleared by the [place]  FDA and has been authorized for detection and/or diagnosis of SARS-CoV-2 by FDA under an Emergency Use Authorization (EUA). This EUA will remain in effect (meaning this test can be used) for the duration of the COVID-19 declaration under Section 564(b)(1) of the Act, 21 U.S.C. section 360bbb-3(b)(1), unless the authorization is terminated or revoked.     Resp Syncytial Virus by PCR NEGATIVE NEGATIVE Final    Comment: (NOTE) Fact Sheet for Patients: BloggerCourse.com  Fact Sheet for Healthcare Providers: SeriousBroker.it  This test is not yet approved or cleared by the [place]  FDA and has been authorized for detection and/or diagnosis of SARS-CoV-2 by FDA under an Emergency Use Authorization (EUA). This EUA will remain in effect (meaning this test can be used) for the duration of the COVID-19 declaration under Section 564(b)(1) of the Act, 21 U.S.C. section 360bbb-3(b)(1), unless the authorization is terminated or revoked.  Performed at [facility], [address], [place], [place] [address]   MRSA Next Gen by PCR, Nasal     Status: None   Collection Time: 04/11/24 10:52 PM   Specimen: Nasal Mucosa; Nasal Swab  Result Value Ref Range Status   MRSA by PCR Next Gen NOT DETECTED NOT DETECTED Final    Comment: (NOTE) The GeneXpert MRSA Assay (FDA approved for NASAL specimens only), is one component of a comprehensive MRSA colonization surveillance program. It is not intended to diagnose MRSA infection nor to guide or monitor treatment for MRSA infections. Test  performance is not FDA approved in patients less than 67 years old. Performed at [facility], [address], [place], [place] [address]   Culture, BAL-quantitative w Gram Stain     Status: None (Preliminary result)   Collection Time: 04/12/24  1:13 PM   Specimen: Bronchial Alveolar Lavage; Respiratory  Result Value Ref Range Status   Specimen Description BRONCHIAL ALVEOLAR LAVAGE  Final   Special Requests NONE  Final   Gram Stain   Final    FEW WBC PRESENT, PREDOMINANTLY PMN NO ORGANISMS SEEN    Culture   Final    NO GROWTH 2 DAYS Performed at [facility], [address], [place], [place] [address]    Report Status PENDING  Incomplete  Culture, Respiratory w Gram Stain     Status: None (Preliminary  result)   Collection Time: 04/12/24  1:13 PM   Specimen: Bronchial Alveolar Lavage; Respiratory  Result Value Ref Range Status   Specimen Description BRONCHIAL ALVEOLAR LAVAGE  Final   Special Requests NONE  Final   Gram Stain   Final    FEW WBC PRESENT, PREDOMINANTLY PMN NO ORGANISMS SEEN    Culture   Final    NO GROWTH 2 DAYS Performed at [facility], [address], [place], [place] [address]    Report Status PENDING  Incomplete     Labs: BNP (last 3 results) No results for input(s): BNP in the last 8760 hours. Basic Metabolic Panel: Recent Labs  Lab 04/11/24 1035 04/12/24 0218 04/13/24 0206  NA 127* 132* 133*  K 3.5 3.9 3.8  CL 91* 101 102  CO2 21* 23 21*  GLUCOSE 105* 99 105*  BUN 15 12 14   CREATININE 0.75 0.57 0.63  CALCIUM 8.5* 8.1* 8.2*  MG  --  1.7  --    Liver Function Tests: Recent Labs  Lab 04/11/24 1035 04/12/24 0218  AST 20 13*  ALT 13 12  ALKPHOS 58 46  BILITOT 1.1 0.7  PROT 7.3 5.6*  ALBUMIN 3.6 2.6*   Recent Labs  Lab 04/11/24 1517  LIPASE 25   No results for input(s): AMMONIA in the last 168 hours. CBC: Recent Labs  Lab 04/11/24 1035 04/12/24 0218 04/13/24 0206  WBC 26.7* 19.3* 7.6  NEUTROABS 24.0*   --   --   HGB 12.2 10.1* 9.4*  HCT 36.4 29.9* 28.7*  MCV 84.3 84.0 85.2  PLT 343 275 313   Cardiac Enzymes: No results for input(s): CKTOTAL, CKMB, CKMBINDEX, TROPONINI in the last 168 hours. BNP: Invalid input(s): POCBNP CBG: No results for input(s): GLUCAP in the last 168 hours. D-Dimer No results for input(s): DDIMER in the last 72 hours.  Hgb A1c No results for input(s): HGBA1C in the last 72 hours. Lipid Profile No results for input(s): CHOL, HDL, LDLCALC, TRIG, CHOLHDL, LDLDIRECT in the last 72 hours. Thyroid function studies No results for input(s): TSH, T4TOTAL, T3FREE, THYROIDAB in the last 72 hours.  Invalid input(s): FREET3 Anemia work up No results for input(s): VITAMINB12, FOLATE, FERRITIN, TIBC, IRON, RETICCTPCT in the last 72 hours. Urinalysis    Component Value Date/Time   COLORURINE YELLOW 07/23/2008 1400   APPEARANCEUR CLEAR 07/23/2008 1400   LABSPEC 1.023 07/23/2008 1400   PHURINE 6.5 07/23/2008 1400   GLUCOSEU NEGATIVE 07/23/2008 1400   HGBUR NEGATIVE 07/23/2008 1400   BILIRUBINUR SMALL (A) 07/23/2008 1400   KETONESUR TRACE (A) 07/23/2008 1400   PROTEINUR NEGATIVE 07/23/2008 1400   UROBILINOGEN 1.0 07/23/2008 1400   NITRITE NEGATIVE 07/23/2008 1400   LEUKOCYTESUR SMALL (A) 07/23/2008 1400   Sepsis Labs Recent Labs  Lab 04/11/24 1035 04/12/24 0218 04/13/24 0206  WBC 26.7* 19.3* 7.6   Microbiology Recent Results (from the past 240 hours)  Culture, blood (Routine X 2) w Reflex to ID Panel     Status: None (Preliminary result)   Collection Time: 04/11/24  3:17 PM   Specimen: BLOOD  Result Value Ref Range Status   Specimen Description BLOOD BLOOD LEFT ARM  Final   Special Requests   Final    BOTTLES DRAWN AEROBIC AND ANAEROBIC Blood Culture adequate volume   Culture   Final    NO GROWTH 3 DAYS Performed at [facility], [address] [place], [place] [address]    Report Status PENDING   Incomplete  Respiratory (~20 pathogens) panel by PCR     Status: Abnormal   Collection Time: 04/11/24  3:37 PM   Specimen: Nasopharyngeal Swab; Respiratory  Result Value Ref Range Status   Adenovirus NOT DETECTED NOT DETECTED Final   Coronavirus 229E NOT DETECTED NOT DETECTED Final    Comment: (NOTE) The Coronavirus on the Respiratory Panel, DOES NOT test for the novel  Coronavirus (2019 nCoV)    Coronavirus HKU1 NOT DETECTED NOT DETECTED Final   Coronavirus NL63 NOT DETECTED NOT DETECTED Final   Coronavirus OC43 NOT DETECTED NOT DETECTED Final   Metapneumovirus NOT DETECTED NOT DETECTED Final   Rhinovirus / Enterovirus DETECTED (A) NOT DETECTED Final   Influenza A NOT DETECTED NOT DETECTED Final   Influenza B NOT DETECTED NOT DETECTED Final   Parainfluenza Virus 1 NOT DETECTED NOT DETECTED Final   Parainfluenza Virus 2 NOT DETECTED NOT DETECTED Final   Parainfluenza Virus 3 NOT DETECTED NOT DETECTED Final   Parainfluenza Virus 4 NOT DETECTED NOT DETECTED Final   Respiratory Syncytial Virus NOT DETECTED NOT DETECTED Final   Bordetella pertussis NOT DETECTED NOT DETECTED Final   Bordetella Parapertussis NOT DETECTED NOT DETECTED Final   Chlamydophila pneumoniae NOT DETECTED NOT DETECTED Final   Mycoplasma pneumoniae NOT DETECTED NOT DETECTED Final    Comment: Performed at [facility], [address], [place], [place] [address]  Resp panel by RT-PCR (RSV, Flu A&B, Covid) Anterior Nasal Swab     Status: None   Collection Time: 04/11/24  3:37 PM   Specimen: Anterior Nasal Swab  Result Value Ref Range Status   SARS Coronavirus 2 by RT PCR NEGATIVE NEGATIVE Final    Comment: (NOTE) SARS-CoV-2 target nucleic acids are NOT DETECTED.  The SARS-CoV-2 RNA is generally detectable in upper respiratory specimens during the acute phase of infection. The lowest concentration of SARS-CoV-2 viral copies this assay can detect is 138 copies/mL. A negative result does not preclude  SARS-Cov-2 infection and should not be used as the sole basis for treatment or other patient management decisions. A negative result may occur with  improper specimen collection/handling, submission of specimen other than nasopharyngeal swab, presence of viral mutation(s) within the areas targeted by this assay, and inadequate number of viral copies(<138 copies/mL). A negative result must be combined with clinical observations, patient history, and epidemiological information. The expected result is Negative.  Fact Sheet for Patients:  BloggerCourse.com  Fact Sheet for Healthcare Providers:  SeriousBroker.it  This test is no t yet approved or cleared by the [place]  FDA and  has been authorized for detection and/or diagnosis of SARS-CoV-2 by FDA under an Emergency Use Authorization (EUA). This EUA will remain  in effect (meaning this test can be used) for the duration of the COVID-19 declaration under Section 564(b)(1) of the Act, 21 U.S.C.section 360bbb-3(b)(1), unless the authorization is terminated  or revoked sooner.       Influenza A by PCR NEGATIVE NEGATIVE Final   Influenza B by PCR NEGATIVE NEGATIVE Final    Comment: (NOTE) The Xpert Xpress SARS-CoV-2/FLU/RSV plus assay is intended as an aid in the diagnosis of influenza from Nasopharyngeal swab specimens and should not be used as a sole basis for treatment. Nasal washings and aspirates are unacceptable for Xpert Xpress SARS-CoV-2/FLU/RSV testing.  Fact Sheet for Patients: BloggerCourse.com  Fact Sheet for Healthcare Providers: SeriousBroker.it  This test is not yet approved or cleared by the [place]  FDA and has been authorized for detection and/or diagnosis of  SARS-CoV-2 by FDA under an Emergency Use Authorization (EUA). This EUA will remain in effect (meaning this test can be used) for the duration of  the COVID-19 declaration under Section 564(b)(1) of the Act, 21 U.S.C. section 360bbb-3(b)(1), unless the authorization is terminated or revoked.     Resp Syncytial Virus by PCR NEGATIVE NEGATIVE Final    Comment: (NOTE) Fact Sheet for Patients: BloggerCourse.com  Fact Sheet for Healthcare Providers: SeriousBroker.it  This test is not yet approved or cleared by the [place]  FDA and has been authorized for detection and/or diagnosis of SARS-CoV-2 by FDA under an Emergency Use Authorization (EUA). This EUA will remain in effect (meaning this test can be used) for the duration of the COVID-19 declaration under Section 564(b)(1) of the Act, 21 U.S.C. section 360bbb-3(b)(1), unless the authorization is terminated or revoked.  Performed at [facility], [address], [place], [place] [address]   MRSA Next Gen by PCR, Nasal     Status: None   Collection Time: 04/11/24 10:52 PM   Specimen: Nasal Mucosa; Nasal Swab  Result Value Ref Range Status   MRSA by PCR Next Gen NOT DETECTED NOT DETECTED Final    Comment: (NOTE) The GeneXpert MRSA Assay (FDA approved for NASAL specimens only), is one component of a comprehensive MRSA colonization surveillance program. It is not intended to diagnose MRSA infection nor to guide or monitor treatment for MRSA infections. Test performance is not FDA approved in patients less than 68 years old. Performed at [facility], [address], [place], [place] [address]   Culture, BAL-quantitative w Gram Stain     Status: None (Preliminary result)   Collection Time: 04/12/24  1:13 PM   Specimen: Bronchial Alveolar Lavage; Respiratory  Result Value Ref Range Status   Specimen Description BRONCHIAL ALVEOLAR LAVAGE  Final   Special Requests NONE  Final   Gram Stain   Final    FEW WBC PRESENT, PREDOMINANTLY PMN NO ORGANISMS SEEN    Culture   Final    NO GROWTH 2 DAYS Performed at [facility], [address] [place], [place] [address]    Report Status PENDING  Incomplete  Culture, Respiratory w Gram Stain     Status: None (Preliminary result)   Collection Time: 04/12/24  1:13 PM   Specimen: Bronchial Alveolar Lavage; Respiratory  Result Value Ref Range Status   Specimen Description BRONCHIAL ALVEOLAR LAVAGE  Final   Special Requests NONE  Final   Gram Stain   Final    FEW WBC PRESENT, PREDOMINANTLY PMN NO ORGANISMS SEEN    Culture   Final    NO GROWTH 2 DAYS Performed at [facility], [address], [place], [place] [address]    Report Status PENDING  Incomplete    Please note: You were cared for by a hospitalist during your hospital stay. Once you are discharged, your primary care physician will handle any further medical issues. Please note that NO REFILLS for any discharge medications will be authorized once you are discharged, as it is imperative that you return to your primary care physician (or establish a relationship with a primary care physician if you do not have one) for your post hospital discharge needs so that they can reassess your need for medications and monitor your lab values.    Time coordinating discharge: 40 minutes  SIGNED:   Ivonne Mustache, MD  [facility] 04/14/2024, 11:48 AM Pager [phone]  If 7PM-7AM, please contact  night-coverage [email or web address] [facility] Physician Discharge Summary  WILLIE LOY FMW:[id number] DOB: Jan 31, 1956 DOA: 04/11/2024  PCP: Baird Comer [name], NP  Admit date: 04/11/2024 Discharge date: 04/14/2024  Admitted From: Home Disposition:  Home  Discharge Condition:Stable CODE STATUS:FULL, DNR, Comfort Care Diet recommendation: Heart Healthy / Carb Modified / Regular / Dysphagia   Brief/Interim Summary:   Following problems were addressed during the hospitalization:   Discharge Diagnoses:  Principal Problem:   Acute respiratory failure with hypoxia (HCC) Active Problems:    Obstructive pneumonia   Hyponatremia   Atypical chest pain   Intractable vomiting   Abnormal CT of the chest   History of tobacco abuse   Pneumonia of right middle lobe due to infectious organism    Discharge Instructions  Discharge Instructions     Diet - low sodium heart healthy   Complete by: As directed    Discharge instructions   Complete by: As directed    1)Please take your medication as instructed 2)Follow up with your PCP in a week 3)Follow up with pulmonology as an outpatient.  Name and number of the provider group has been attached   Increase activity slowly   Complete by: As directed    Pulmonary Visit   Complete by: As directed    Lung cancer screening,PFT   Reason for referral: Other Pulmonary      Allergies as of 04/14/2024       Reactions   Bee Venom Anaphylaxis   Fire Ant Anaphylaxis, Hives   Wasp Venom Anaphylaxis, Hives, Shortness Of Breath, Swelling   Codeine Itching        Medication List     TAKE these medications    alendronate 70 MG tablet Commonly known as: FOSAMAX Take 70 mg by mouth once a week.   amoxicillin -clavulanate 875-125 MG tablet Commonly known as: AUGMENTIN  Take 1 tablet by mouth 2 (two) times daily for 1 day. Start taking on: April 15, 2024   EPINEPHrine  0.3 mg/0.3 mL Soaj injection Commonly known as: EPI-PEN Inject 0.3 mg into the muscle as needed for anaphylaxis.   ergocalciferol 1.25 MG (50000 UT) capsule Commonly known as: VITAMIN D2 Take 50,000 Units by mouth once a week.   FT Pain Relief Max Strength 4 % Generic drug: lidocaine  Place 1 patch onto the skin daily. Apply on painful area of right chest   ibuprofen 800 MG tablet Commonly known as: ADVIL Take 800 mg by mouth every 8 (eight) hours as needed for moderate pain or headache.   Linzess  145 MCG Caps capsule Generic drug: linaclotide  Take 145 mcg by mouth daily as needed for constipation.   MULTIVITAMIN GUMMIES ADULTS PO Take 4 tablets by mouth  daily.   omeprazole 20 MG capsule Commonly known as: PRILOSEC Take 20 mg by mouth 2 (two) times daily as needed (heartburn).   ondansetron  4 MG disintegrating tablet Commonly known as: ZOFRAN -ODT Take 1 tablet (4 mg total) by mouth every 8 (eight) hours as needed for nausea or vomiting.   rizatriptan 10 MG tablet Commonly known as: MAXALT Take 10 mg by mouth as needed for migraine.   simethicone 80 MG chewable tablet Commonly known as: MYLICON Chew 80 mg by mouth 3 (three) times daily as needed for flatulence.   simvastatin  40 MG tablet Commonly known as: ZOCOR  Take 40 mg by mouth at bedtime.   zolpidem  10 MG tablet Commonly known as: AMBIEN  Take 10 mg by mouth at bedtime as needed for sleep.  Follow-up Information     [place], Comer [name], NP. Schedule an appointment as soon as possible for a visit in 1 week(s).   Specialty: Adult Health Nurse Practitioner Contact information: [address] [place] [address] [phone]         [facility]. Schedule an appointment as soon as possible for a visit in 2 week(s).   Specialty: Pulmonology Contact information: [address] [place] [address] [place]  [address] [phone] Additional information: [address]  [address]  [place], [place] [address]               Allergies  Allergen Reactions   Bee Venom Anaphylaxis   Fire Ant Anaphylaxis and Hives   Wasp Venom Anaphylaxis, Hives, Shortness Of Breath and Swelling   Codeine Itching    Consultations:    Procedures/Studies: ECHOCARDIOGRAM COMPLETE Result Date: 04/12/2024    ECHOCARDIOGRAM REPORT   Patient Name:   TALEYA WHITCHER Date of Exam: 04/12/2024 Medical Rec #:  [id number]      Height:       65.0 in Accession #:    [id number]     Weight:       118.0 lb Date of Birth:  04-07-1956       BSA:          1.581 m Patient Age:    68 years       BP:           118/66 mmHg Patient  Gender: F              HR:           70 bpm. Exam Location:  Inpatient Procedure: 2D Echo, Cardiac Doppler and Color Doppler (Both Spectral and Color            Flow Doppler were utilized during procedure). Indications:    Chest pain  History:        Patient has no prior history of Echocardiogram examinations.  Sonographer:    Benard Stallion Referring Phys: [phone] DAVID TAT IMPRESSIONS  1. Left ventricular ejection fraction, by estimation, is 60 to 65%. The left ventricle has normal function. The left ventricle has no regional wall motion abnormalities. Left ventricular diastolic parameters are consistent with Grade I diastolic dysfunction (impaired relaxation).  2. Right ventricular systolic function is normal. The right ventricular size is normal. There is moderately elevated pulmonary artery systolic pressure.  3. Left atrial size was mildly dilated.  4. The mitral valve is normal in structure. Mild mitral valve regurgitation. No evidence of mitral stenosis.  5. The aortic valve is tricuspid. Aortic valve regurgitation is not visualized. No aortic stenosis is present.  6. The inferior vena cava is dilated in size with <50% respiratory variability, suggesting right atrial pressure of 15 mmHg. FINDINGS  Left Ventricle: Left ventricular ejection fraction, by estimation, is 60 to 65%. The left ventricle has normal function. The left ventricle has no regional wall motion abnormalities. The left ventricular internal cavity size was normal in size. There is  no left ventricular hypertrophy. Left ventricular diastolic parameters are consistent with Grade I diastolic dysfunction (impaired relaxation). Right Ventricle: The right ventricular size is normal. No increase in right ventricular wall thickness. Right ventricular systolic function is normal. There is moderately elevated pulmonary artery systolic pressure. The tricuspid regurgitant velocity is 3.22 m/s, and with an assumed right atrial pressure of 15 mmHg, the  estimated right ventricular systolic  pressure is 56.5 mmHg. Left Atrium: Left atrial size was mildly dilated. Right Atrium: Right atrial size was normal in size. Pericardium: There is no evidence of pericardial effusion. Mitral Valve: The mitral valve is normal in structure. Mild mitral valve regurgitation. No evidence of mitral valve stenosis. Tricuspid Valve: The tricuspid valve is normal in structure. Tricuspid valve regurgitation is mild . No evidence of tricuspid stenosis. Aortic Valve: The aortic valve is tricuspid. Aortic valve regurgitation is not visualized. No aortic stenosis is present. Aortic valve mean gradient measures 5.0 mmHg. Aortic valve peak gradient measures 9.7 mmHg. Aortic valve area, by VTI measures 2.43 cm. Pulmonic Valve: The pulmonic valve was normal in structure. Pulmonic valve regurgitation is not visualized. No evidence of pulmonic stenosis. Aorta: The aortic root is normal in size and structure. Venous: The inferior vena cava is dilated in size with less than 50% respiratory variability, suggesting right atrial pressure of 15 mmHg. IAS/Shunts: No atrial level shunt detected by color flow Doppler.  LEFT VENTRICLE PLAX 2D LVIDd:         4.90 cm   Diastology LVIDs:         3.40 cm   LV e' medial:    7.51 cm/s LV PW:         0.80 cm   LV E/e' medial:  12.1 LV IVS:        0.70 cm   LV e' lateral:   11.40 cm/s LVOT diam:     2.10 cm   LV E/e' lateral: 8.0 LV SV:         72 LV SV Index:   45 LVOT Area:     3.46 cm  RIGHT VENTRICLE RV Basal diam:  3.50 cm RV Mid diam:    2.80 cm RV S prime:     15.90 cm/s TAPSE (M-mode): 3.5 cm LEFT ATRIUM             Index        RIGHT ATRIUM           Index LA diam:        3.40 cm 2.15 cm/m   RA Area:     12.80 cm LA Vol (A2C):   65.7 ml 41.56 ml/m  RA Volume:   28.50 ml  18.03 ml/m LA Vol (A4C):   57.2 ml 36.19 ml/m LA Biplane Vol: 62.5 ml 39.54 ml/m  AORTIC VALVE AV Area (Vmax):    2.40 cm AV Area (Vmean):   2.29 cm AV Area (VTI):     2.43 cm AV  Vmax:           156.00 cm/s AV Vmean:          105.500 cm/s AV VTI:            0.295 m AV Peak Grad:      9.7 mmHg AV Mean Grad:      5.0 mmHg LVOT Vmax:         108.00 cm/s LVOT Vmean:        69.700 cm/s LVOT VTI:          0.207 m LVOT/AV VTI ratio: 0.70  AORTA Ao Root diam: 3.00 cm MITRAL VALVE               TRICUSPID VALVE MV Area (PHT): 4.21 cm    TR Peak grad:   41.5 mmHg MV Decel Time: 180 msec    TR Vmax:        322.00 cm/s  MR Peak grad: 127.7 mmHg MR Vmax:      565.00 cm/s  SHUNTS MV E velocity: 90.70 cm/s  Systemic VTI:  0.21 m MV A velocity: 87.30 cm/s  Systemic Diam: 2.10 cm MV E/A ratio:  1.04 Morene Brownie Electronically signed by Morene Brownie Signature Date/Time: 04/12/2024/3:21:34 PM    Final    US  Abdomen Limited RUQ (LIVER/GB) Result Date: 04/11/2024 CLINICAL DATA:  Evaluate thickening of gallbladder wall with pericholecystic fluid. EXAM: ULTRASOUND ABDOMEN LIMITED RIGHT UPPER QUADRANT COMPARISON:  March 14, 2013 FINDINGS: Gallbladder: No gallstones or wall thickening visualized (2.3 mm). A trace amount of pericholecystic fluid is seen. No sonographic Thayne sign noted by sonographer. Common bile duct: Diameter: 1.5 mm Liver: No focal lesion identified. Diffusely increased echogenicity of the renal parenchyma is noted. Portal vein is patent on color Doppler imaging with normal direction of blood flow towards the liver. Other: Of incidental note is the presence of a right-sided pleural effusion. IMPRESSION: 1. Trace amount of pericholecystic fluid. 2. Right pleural effusion. Electronically Signed   By: Suzen Dials M.D.   On: 04/11/2024 20:10   CT Angio Chest PE W and/or Wo Contrast Result Date: 04/11/2024 CLINICAL DATA:  Chest abdominal pain.  Nausea and vomiting. EXAM: CT ANGIOGRAPHY CHEST CT ABDOMEN AND PELVIS WITH CONTRAST TECHNIQUE: Multidetector CT imaging of the chest was performed using the standard protocol during bolus administration of intravenous contrast. Multiplanar CT  image reconstructions and MIPs were obtained to evaluate the vascular anatomy. Multidetector CT imaging of the abdomen and pelvis was performed using the standard protocol during bolus administration of intravenous contrast. RADIATION DOSE REDUCTION: This exam was performed according to the departmental dose-optimization program which includes automated exposure control, adjustment of the mA and/or kV according to patient size and/or use of iterative reconstruction technique. CONTRAST:  75mL OMNIPAQUE  IOHEXOL  350 MG/ML SOLN COMPARISON:  Chest radiograph dated 04/11/2024. FINDINGS: Evaluation of this exam is limited due to respiratory motion. CTA CHEST FINDINGS Cardiovascular: There is no cardiomegaly or pericardial effusion. Mild atherosclerotic calcification of the thoracic aorta. No aneurysm dilatation or dissection. The origins of the great vessels of the aortic arch are patent. No pulmonary artery embolus identified. Mediastinum/Nodes: No hilar or mediastinal adenopathy. The esophagus is grossly unremarkable. No mediastinal fluid collection. Lungs/Pleura: There is complete collapse of the right middle lobe. There is a luminal narrowing of the right middle lobe bronchus which may be due to mucous impaction. An endobronchial lesion is not excluded. Clinical correlation recommended bronchoscopy may provide better evaluation. There is background of emphysema. Small right pleural effusion. No pneumothorax. Musculoskeletal: Osteopenia with degenerative changes of the spine. No acute osseous pathology. Review of the MIP images confirms the above findings. CT ABDOMEN and PELVIS FINDINGS No intra-abdominal free air.  Small free fluid in the pelvis. Hepatobiliary: The liver is unremarkable. There is mild dilatation versus mild periportal edema. The gallbladder is unremarkable. There is a small pericholecystic fluid. Pancreas: Unremarkable. No pancreatic ductal dilatation or surrounding inflammatory changes. Spleen: Normal  in size without focal abnormality. Adrenals/Urinary Tract: The adrenal glands unremarkable. Small right renal interpolar cyst. There is no hydronephrosis on either side. There is symmetric enhancement and excretion of contrast by both kidneys. The visualized ureters and urinary bladder appear unremarkable. Stomach/Bowel: There is postsurgical changes of the bowel with anastomotic staple line in the right lower abdomen. There is no bowel obstruction or active inflammation. Appendectomy. Vascular/Lymphatic: Moderate aortoiliac atherosclerotic disease. The IVC is unremarkable. No portal venous gas. There is no  adenopathy. Reproductive: Hysterectomy.  No suspicious adnexal masses. Other: None Musculoskeletal: Osteopenia with degenerative changes of the spine. No acute osseous pathology. Review of the MIP images confirms the above findings. IMPRESSION: 1. No CT evidence of pulmonary artery embolus. 2. Complete collapse of the right middle lobe with luminal narrowing of the right middle lobe bronchus which may be due to mucous impaction. An endobronchial lesion is not excluded. Bronchoscopy may provide better evaluation. 3. Small right pleural effusion. 4. Mild periportal edema and trace pericholecystic fluid. Correlation with LFTs recommended. 5. No bowel obstruction. 6. Aortic Atherosclerosis (ICD10-I70.0) and Emphysema (ICD10-J43.9). Electronically Signed   By: Vanetta Chou M.D.   On: 04/11/2024 12:46   CT ABDOMEN PELVIS W CONTRAST Result Date: 04/11/2024 CLINICAL DATA:  Chest abdominal pain.  Nausea and vomiting. EXAM: CT ANGIOGRAPHY CHEST CT ABDOMEN AND PELVIS WITH CONTRAST TECHNIQUE: Multidetector CT imaging of the chest was performed using the standard protocol during bolus administration of intravenous contrast. Multiplanar CT image reconstructions and MIPs were obtained to evaluate the vascular anatomy. Multidetector CT imaging of the abdomen and pelvis was performed using the standard protocol during  bolus administration of intravenous contrast. RADIATION DOSE REDUCTION: This exam was performed according to the departmental dose-optimization program which includes automated exposure control, adjustment of the mA and/or kV according to patient size and/or use of iterative reconstruction technique. CONTRAST:  75mL OMNIPAQUE  IOHEXOL  350 MG/ML SOLN COMPARISON:  Chest radiograph dated 04/11/2024. FINDINGS: Evaluation of this exam is limited due to respiratory motion. CTA CHEST FINDINGS Cardiovascular: There is no cardiomegaly or pericardial effusion. Mild atherosclerotic calcification of the thoracic aorta. No aneurysm dilatation or dissection. The origins of the great vessels of the aortic arch are patent. No pulmonary artery embolus identified. Mediastinum/Nodes: No hilar or mediastinal adenopathy. The esophagus is grossly unremarkable. No mediastinal fluid collection. Lungs/Pleura: There is complete collapse of the right middle lobe. There is a luminal narrowing of the right middle lobe bronchus which may be due to mucous impaction. An endobronchial lesion is not excluded. Clinical correlation recommended bronchoscopy may provide better evaluation. There is background of emphysema. Small right pleural effusion. No pneumothorax. Musculoskeletal: Osteopenia with degenerative changes of the spine. No acute osseous pathology. Review of the MIP images confirms the above findings. CT ABDOMEN and PELVIS FINDINGS No intra-abdominal free air.  Small free fluid in the pelvis. Hepatobiliary: The liver is unremarkable. There is mild dilatation versus mild periportal edema. The gallbladder is unremarkable. There is a small pericholecystic fluid. Pancreas: Unremarkable. No pancreatic ductal dilatation or surrounding inflammatory changes. Spleen: Normal in size without focal abnormality. Adrenals/Urinary Tract: The adrenal glands unremarkable. Small right renal interpolar cyst. There is no hydronephrosis on either side. There is  symmetric enhancement and excretion of contrast by both kidneys. The visualized ureters and urinary bladder appear unremarkable. Stomach/Bowel: There is postsurgical changes of the bowel with anastomotic staple line in the right lower abdomen. There is no bowel obstruction or active inflammation. Appendectomy. Vascular/Lymphatic: Moderate aortoiliac atherosclerotic disease. The IVC is unremarkable. No portal venous gas. There is no adenopathy. Reproductive: Hysterectomy.  No suspicious adnexal masses. Other: None Musculoskeletal: Osteopenia with degenerative changes of the spine. No acute osseous pathology. Review of the MIP images confirms the above findings. IMPRESSION: 1. No CT evidence of pulmonary artery embolus. 2. Complete collapse of the right middle lobe with luminal narrowing of the right middle lobe bronchus which may be due to mucous impaction. An endobronchial lesion is not excluded. Bronchoscopy may provide better evaluation. 3.  Small right pleural effusion. 4. Mild periportal edema and trace pericholecystic fluid. Correlation with LFTs recommended. 5. No bowel obstruction. 6. Aortic Atherosclerosis (ICD10-I70.0) and Emphysema (ICD10-J43.9). Electronically Signed   By: Vanetta Chou M.D.   On: 04/11/2024 12:46   DG Chest Port 1 View Result Date: 04/11/2024 CLINICAL DATA:  Right-sided chest pain and tenderness.  Recent fall. EXAM: PORTABLE CHEST 1 VIEW COMPARISON:  07/18/2013 FINDINGS: The heart size and mediastinal contours are within normal limits. Both lungs are clear. No pneumothorax or hemothorax visualized. The visualized skeletal structures are unremarkable. IMPRESSION: No active disease. Electronically Signed   By: Norleen DELENA Kil M.D.   On: 04/11/2024 11:11      Subjective:   Discharge Exam: Vitals:   04/14/24 0440 04/14/24 0757  BP: (!) 149/81 (!) 150/70  Pulse: 62 65  Resp: 18 18  Temp: 98.2 F (36.8 C) 98.3 F (36.8 C)  SpO2: 90% 92%   Vitals:   04/13/24 1927 04/13/24  2342 04/14/24 0440 04/14/24 0757  BP: 136/60 (!) 113/48 (!) 149/81 (!) 150/70  Pulse: 64 69 62 65  Resp: 19 16 18 18   Temp: 98 F (36.7 C) 98.3 F (36.8 C) 98.2 F (36.8 C) 98.3 F (36.8 C)  TempSrc: Oral  Oral Oral  SpO2:  94% 90% 92%  Weight:      Height:        General: Pt is alert, awake, not in acute distress Cardiovascular: RRR, S1/S2 +, no rubs, no gallops Respiratory: CTA bilaterally, no wheezing, no rhonchi Abdominal: Soft, NT, ND, bowel sounds + Extremities: no edema, no cyanosis    The results of significant diagnostics from this hospitalization (including imaging, microbiology, ancillary and laboratory) are listed below for reference.     Microbiology: Recent Results (from the past 240 hours)  Culture, blood (Routine X 2) w Reflex to ID Panel     Status: None (Preliminary result)   Collection Time: 04/11/24  3:17 PM   Specimen: BLOOD  Result Value Ref Range Status   Specimen Description BLOOD BLOOD LEFT ARM  Final   Special Requests   Final    BOTTLES DRAWN AEROBIC AND ANAEROBIC Blood Culture adequate volume   Culture   Final    NO GROWTH 3 DAYS Performed at [facility], [address], [place], [place] [address]    Report Status PENDING  Incomplete  Respiratory (~20 pathogens) panel by PCR     Status: Abnormal   Collection Time: 04/11/24  3:37 PM   Specimen: Nasopharyngeal Swab; Respiratory  Result Value Ref Range Status   Adenovirus NOT DETECTED NOT DETECTED Final   Coronavirus 229E NOT DETECTED NOT DETECTED Final    Comment: (NOTE) The Coronavirus on the Respiratory Panel, DOES NOT test for the novel  Coronavirus (2019 nCoV)    Coronavirus HKU1 NOT DETECTED NOT DETECTED Final   Coronavirus NL63 NOT DETECTED NOT DETECTED Final   Coronavirus OC43 NOT DETECTED NOT DETECTED Final   Metapneumovirus NOT DETECTED NOT DETECTED Final   Rhinovirus / Enterovirus DETECTED (A) NOT DETECTED Final   Influenza A NOT DETECTED NOT DETECTED Final   Influenza B NOT  DETECTED NOT DETECTED Final   Parainfluenza Virus 1 NOT DETECTED NOT DETECTED Final   Parainfluenza Virus 2 NOT DETECTED NOT DETECTED Final   Parainfluenza Virus 3 NOT DETECTED NOT DETECTED Final   Parainfluenza Virus 4 NOT DETECTED NOT DETECTED Final   Respiratory Syncytial Virus NOT DETECTED NOT DETECTED Final   Bordetella pertussis NOT DETECTED NOT  DETECTED Final   Bordetella Parapertussis NOT DETECTED NOT DETECTED Final   Chlamydophila pneumoniae NOT DETECTED NOT DETECTED Final   Mycoplasma pneumoniae NOT DETECTED NOT DETECTED Final    Comment: Performed at [facility], [address] [place], [place] [address]  Resp panel by RT-PCR (RSV, Flu A&B, Covid) Anterior Nasal Swab     Status: None   Collection Time: 04/11/24  3:37 PM   Specimen: Anterior Nasal Swab  Result Value Ref Range Status   SARS Coronavirus 2 by RT PCR NEGATIVE NEGATIVE Final    Comment: (NOTE) SARS-CoV-2 target nucleic acids are NOT DETECTED.  The SARS-CoV-2 RNA is generally detectable in upper respiratory specimens during the acute phase of infection. The lowest concentration of SARS-CoV-2 viral copies this assay can detect is 138 copies/mL. A negative result does not preclude SARS-Cov-2 infection and should not be used as the sole basis for treatment or other patient management decisions. A negative result may occur with  improper specimen collection/handling, submission of specimen other than nasopharyngeal swab, presence of viral mutation(s) within the areas targeted by this assay, and inadequate number of viral copies(<138 copies/mL). A negative result must be combined with clinical observations, patient history, and epidemiological information. The expected result is Negative.  Fact Sheet for Patients:  BloggerCourse.com  Fact Sheet for Healthcare Providers:  SeriousBroker.it  This test is no t yet approved or cleared by the [place]  FDA  and  has been authorized for detection and/or diagnosis of SARS-CoV-2 by FDA under an Emergency Use Authorization (EUA). This EUA will remain  in effect (meaning this test can be used) for the duration of the COVID-19 declaration under Section 564(b)(1) of the Act, 21 U.S.C.section 360bbb-3(b)(1), unless the authorization is terminated  or revoked sooner.       Influenza A by PCR NEGATIVE NEGATIVE Final   Influenza B by PCR NEGATIVE NEGATIVE Final    Comment: (NOTE) The Xpert Xpress SARS-CoV-2/FLU/RSV plus assay is intended as an aid in the diagnosis of influenza from Nasopharyngeal swab specimens and should not be used as a sole basis for treatment. Nasal washings and aspirates are unacceptable for Xpert Xpress SARS-CoV-2/FLU/RSV testing.  Fact Sheet for Patients: BloggerCourse.com  Fact Sheet for Healthcare Providers: SeriousBroker.it  This test is not yet approved or cleared by the [place]  FDA and has been authorized for detection and/or diagnosis of SARS-CoV-2 by FDA under an Emergency Use Authorization (EUA). This EUA will remain in effect (meaning this test can be used) for the duration of the COVID-19 declaration under Section 564(b)(1) of the Act, 21 U.S.C. section 360bbb-3(b)(1), unless the authorization is terminated or revoked.     Resp Syncytial Virus by PCR NEGATIVE NEGATIVE Final    Comment: (NOTE) Fact Sheet for Patients: BloggerCourse.com  Fact Sheet for Healthcare Providers: SeriousBroker.it  This test is not yet approved or cleared by the [place]  FDA and has been authorized for detection and/or diagnosis of SARS-CoV-2 by FDA under an Emergency Use Authorization (EUA). This EUA will remain in effect (meaning this test can be used) for the duration of the COVID-19 declaration under Section 564(b)(1) of the Act, 21 U.S.C. section 360bbb-3(b)(1),  unless the authorization is terminated or revoked.  Performed at [facility], [address] [place], [place] [address]   MRSA Next Gen by PCR, Nasal     Status: None   Collection Time: 04/11/24 10:52 PM   Specimen: Nasal Mucosa; Nasal Swab  Result Value Ref Range Status  MRSA by PCR Next Gen NOT DETECTED NOT DETECTED Final    Comment: (NOTE) The GeneXpert MRSA Assay (FDA approved for NASAL specimens only), is one component of a comprehensive MRSA colonization surveillance program. It is not intended to diagnose MRSA infection nor to guide or monitor treatment for MRSA infections. Test performance is not FDA approved in patients less than 12 years old. Performed at [facility], [address] [place], [place] [address]   Culture, BAL-quantitative w Gram Stain     Status: None (Preliminary result)   Collection Time: 04/12/24  1:13 PM   Specimen: Bronchial Alveolar Lavage; Respiratory  Result Value Ref Range Status   Specimen Description BRONCHIAL ALVEOLAR LAVAGE  Final   Special Requests NONE  Final   Gram Stain   Final    FEW WBC PRESENT, PREDOMINANTLY PMN NO ORGANISMS SEEN    Culture   Final    NO GROWTH 2 DAYS Performed at [facility], [address], [place], [place] [address]    Report Status PENDING  Incomplete  Culture, Respiratory w Gram Stain     Status: None (Preliminary result)   Collection Time: 04/12/24  1:13 PM   Specimen: Bronchial Alveolar Lavage; Respiratory  Result Value Ref Range Status   Specimen Description BRONCHIAL ALVEOLAR LAVAGE  Final   Special Requests NONE  Final   Gram Stain   Final    FEW WBC PRESENT, PREDOMINANTLY PMN NO ORGANISMS SEEN    Culture   Final    NO GROWTH 2 DAYS Performed at [facility], [address], [place], [place] [address]    Report Status PENDING  Incomplete     Labs: BNP (last 3 results) No results for input(s): BNP in the last 8760 hours. Basic Metabolic Panel: Recent Labs  Lab  04/11/24 1035 04/12/24 0218 04/13/24 0206  NA 127* 132* 133*  K 3.5 3.9 3.8  CL 91* 101 102  CO2 21* 23 21*  GLUCOSE 105* 99 105*  BUN 15 12 14   CREATININE 0.75 0.57 0.63  CALCIUM 8.5* 8.1* 8.2*  MG  --  1.7  --    Liver Function Tests: Recent Labs  Lab 04/11/24 1035 04/12/24 0218  AST 20 13*  ALT 13 12  ALKPHOS 58 46  BILITOT 1.1 0.7  PROT 7.3 5.6*  ALBUMIN 3.6 2.6*   Recent Labs  Lab 04/11/24 1517  LIPASE 25   No results for input(s): AMMONIA in the last 168 hours. CBC: Recent Labs  Lab 04/11/24 1035 04/12/24 0218 04/13/24 0206  WBC 26.7* 19.3* 7.6  NEUTROABS 24.0*  --   --   HGB 12.2 10.1* 9.4*  HCT 36.4 29.9* 28.7*  MCV 84.3 84.0 85.2  PLT 343 275 313   Cardiac Enzymes: No results for input(s): CKTOTAL, CKMB, CKMBINDEX, TROPONINI in the last 168 hours. BNP: Invalid input(s): POCBNP CBG: No results for input(s): GLUCAP in the last 168 hours. D-Dimer No results for input(s): DDIMER in the last 72 hours.  Hgb A1c No results for input(s): HGBA1C in the last 72 hours. Lipid Profile No results for input(s): CHOL, HDL, LDLCALC, TRIG, CHOLHDL, LDLDIRECT in the last 72 hours. Thyroid function studies No results for input(s): TSH, T4TOTAL, T3FREE, THYROIDAB in the last 72 hours.  Invalid input(s): FREET3 Anemia work up No results for input(s): VITAMINB12, FOLATE, FERRITIN, TIBC, IRON, RETICCTPCT in the last 72 hours. Urinalysis    Component Value Date/Time   COLORURINE YELLOW 07/23/2008 1400  APPEARANCEUR CLEAR 07/23/2008 1400   LABSPEC 1.023 07/23/2008 1400   PHURINE 6.5 07/23/2008 1400   GLUCOSEU NEGATIVE 07/23/2008 1400   HGBUR NEGATIVE 07/23/2008 1400   BILIRUBINUR SMALL (A) 07/23/2008 1400   KETONESUR TRACE (A) 07/23/2008 1400   PROTEINUR NEGATIVE 07/23/2008 1400   UROBILINOGEN 1.0 07/23/2008 1400   NITRITE NEGATIVE 07/23/2008 1400   LEUKOCYTESUR SMALL (A) 07/23/2008 1400   Sepsis  Labs Recent Labs  Lab 04/11/24 1035 04/12/24 0218 04/13/24 0206  WBC 26.7* 19.3* 7.6   Microbiology Recent Results (from the past 240 hours)  Culture, blood (Routine X 2) w Reflex to ID Panel     Status: None (Preliminary result)   Collection Time: 04/11/24  3:17 PM   Specimen: BLOOD  Result Value Ref Range Status   Specimen Description BLOOD BLOOD LEFT ARM  Final   Special Requests   Final    BOTTLES DRAWN AEROBIC AND ANAEROBIC Blood Culture adequate volume   Culture   Final    NO GROWTH 3 DAYS Performed at [facility], [address], [place], [place] [address]    Report Status PENDING  Incomplete  Respiratory (~20 pathogens) panel by PCR     Status: Abnormal   Collection Time: 04/11/24  3:37 PM   Specimen: Nasopharyngeal Swab; Respiratory  Result Value Ref Range Status   Adenovirus NOT DETECTED NOT DETECTED Final   Coronavirus 229E NOT DETECTED NOT DETECTED Final    Comment: (NOTE) The Coronavirus on the Respiratory Panel, DOES NOT test for the novel  Coronavirus (2019 nCoV)    Coronavirus HKU1 NOT DETECTED NOT DETECTED Final   Coronavirus NL63 NOT DETECTED NOT DETECTED Final   Coronavirus OC43 NOT DETECTED NOT DETECTED Final   Metapneumovirus NOT DETECTED NOT DETECTED Final   Rhinovirus / Enterovirus DETECTED (A) NOT DETECTED Final   Influenza A NOT DETECTED NOT DETECTED Final   Influenza B NOT DETECTED NOT DETECTED Final   Parainfluenza Virus 1 NOT DETECTED NOT DETECTED Final   Parainfluenza Virus 2 NOT DETECTED NOT DETECTED Final   Parainfluenza Virus 3 NOT DETECTED NOT DETECTED Final   Parainfluenza Virus 4 NOT DETECTED NOT DETECTED Final   Respiratory Syncytial Virus NOT DETECTED NOT DETECTED Final   Bordetella pertussis NOT DETECTED NOT DETECTED Final   Bordetella Parapertussis NOT DETECTED NOT DETECTED Final   Chlamydophila pneumoniae NOT DETECTED NOT DETECTED Final   Mycoplasma pneumoniae NOT DETECTED NOT DETECTED Final    Comment: Performed at [facility], [address], [place], [place] [address]  Resp panel by RT-PCR (RSV, Flu A&B, Covid) Anterior Nasal Swab     Status: None   Collection Time: 04/11/24  3:37 PM   Specimen: Anterior Nasal Swab  Result Value Ref Range Status   SARS Coronavirus 2 by RT PCR NEGATIVE NEGATIVE Final    Comment: (NOTE) SARS-CoV-2 target nucleic acids are NOT DETECTED.  The SARS-CoV-2 RNA is generally detectable in upper respiratory specimens during the acute phase of infection. The lowest concentration of SARS-CoV-2 viral copies this assay can detect is 138 copies/mL. A negative result does not preclude SARS-Cov-2 infection and should not be used as the sole basis for treatment or other patient management decisions. A negative result may occur with  improper specimen collection/handling, submission of specimen other than nasopharyngeal swab, presence of viral mutation(s) within the areas targeted by this assay, and inadequate number of viral copies(<138 copies/mL). A negative result must be combined with clinical observations, patient history, and epidemiological information. The  expected result is Negative.  Fact Sheet for Patients:  BloggerCourse.com  Fact Sheet for Healthcare Providers:  SeriousBroker.it  This test is no t yet approved or cleared by the [place]  FDA and  has been authorized for detection and/or diagnosis of SARS-CoV-2 by FDA under an Emergency Use Authorization (EUA). This EUA will remain  in effect (meaning this test can be used) for the duration of the COVID-19 declaration under Section 564(b)(1) of the Act, 21 U.S.C.section 360bbb-3(b)(1), unless the authorization is terminated  or revoked sooner.       Influenza A by PCR NEGATIVE NEGATIVE Final   Influenza B by PCR NEGATIVE NEGATIVE Final    Comment: (NOTE) The Xpert Xpress SARS-CoV-2/FLU/RSV plus assay is intended as an aid in the diagnosis of influenza from  Nasopharyngeal swab specimens and should not be used as a sole basis for treatment. Nasal washings and aspirates are unacceptable for Xpert Xpress SARS-CoV-2/FLU/RSV testing.  Fact Sheet for Patients: BloggerCourse.com  Fact Sheet for Healthcare Providers: SeriousBroker.it  This test is not yet approved or cleared by the [place]  FDA and has been authorized for detection and/or diagnosis of SARS-CoV-2 by FDA under an Emergency Use Authorization (EUA). This EUA will remain in effect (meaning this test can be used) for the duration of the COVID-19 declaration under Section 564(b)(1) of the Act, 21 U.S.C. section 360bbb-3(b)(1), unless the authorization is terminated or revoked.     Resp Syncytial Virus by PCR NEGATIVE NEGATIVE Final    Comment: (NOTE) Fact Sheet for Patients: BloggerCourse.com  Fact Sheet for Healthcare Providers: SeriousBroker.it  This test is not yet approved or cleared by the [place]  FDA and has been authorized for detection and/or diagnosis of SARS-CoV-2 by FDA under an Emergency Use Authorization (EUA). This EUA will remain in effect (meaning this test can be used) for the duration of the COVID-19 declaration under Section 564(b)(1) of the Act, 21 U.S.C. section 360bbb-3(b)(1), unless the authorization is terminated or revoked.  Performed at [facility], [address] [place], [place] [address]   MRSA Next Gen by PCR, Nasal     Status: None   Collection Time: 04/11/24 10:52 PM   Specimen: Nasal Mucosa; Nasal Swab  Result Value Ref Range Status   MRSA by PCR Next Gen NOT DETECTED NOT DETECTED Final    Comment: (NOTE) The GeneXpert MRSA Assay (FDA approved for NASAL specimens only), is one component of a comprehensive MRSA colonization surveillance program. It is not intended to diagnose MRSA infection nor to guide or monitor treatment for  MRSA infections. Test performance is not FDA approved in patients less than 4 years old. Performed at [facility], [address] [place], [place] [address]   Culture, BAL-quantitative w Gram Stain     Status: None (Preliminary result)   Collection Time: 04/12/24  1:13 PM   Specimen: Bronchial Alveolar Lavage; Respiratory  Result Value Ref Range Status   Specimen Description BRONCHIAL ALVEOLAR LAVAGE  Final   Special Requests NONE  Final   Gram Stain   Final    FEW WBC PRESENT, PREDOMINANTLY PMN NO ORGANISMS SEEN    Culture   Final    NO GROWTH 2 DAYS Performed at [facility], [address], [place], [place] [address]    Report Status PENDING  Incomplete  Culture, Respiratory w Gram Stain     Status: None (Preliminary result)   Collection Time: 04/12/24  1:13 PM   Specimen: Bronchial Alveolar Lavage; Respiratory  Result Value Ref Range Status   Specimen Description BRONCHIAL ALVEOLAR LAVAGE  Final   Special Requests NONE  Final   Gram Stain   Final    FEW WBC PRESENT, PREDOMINANTLY PMN NO ORGANISMS SEEN    Culture   Final    NO GROWTH 2 DAYS Performed at [facility], [address], [place], [place] [address]    Report Status PENDING  Incomplete    Please note: You were cared for by a hospitalist during your hospital stay. Once you are discharged, your primary care physician will handle any further medical issues. Please note that NO REFILLS for any discharge medications will be authorized once you are discharged, as it is imperative that you return to your primary care physician (or establish a relationship with a primary care physician if you do not have one) for your post hospital discharge needs so that they can reassess your need for medications and monitor your lab values.    Time coordinating discharge: 40 minutes  SIGNED:   Ivonne Mustache, MD  [facility] 04/14/2024, 11:48 AM Pager [phone]  If 7PM-7AM, please contact  night-coverage [email or web address] Password [email or web address]

## 2024-04-14 NOTE — Plan of Care (Signed)

## 2024-04-15 LAB — CULTURE, BAL-QUANTITATIVE W GRAM STAIN: Culture: NO GROWTH

## 2024-04-15 LAB — CULTURE, RESPIRATORY W GRAM STAIN: Culture: NO GROWTH

## 2024-04-16 LAB — CULTURE, BLOOD (ROUTINE X 2)
Culture: NO GROWTH
Special Requests: ADEQUATE

## 2024-05-16 ENCOUNTER — Other Ambulatory Visit (HOSPITAL_COMMUNITY): Payer: Self-pay

## 2024-06-03 NOTE — Progress Notes (Unsigned)
 Kristy Myers, female    DOB: 1956/04/05    MRN: [id number]   Brief patient profile:  36  yowm  quit smoking June 2025  referred to pulmonary clinic in [facility]  06/04/2024 by riad  for  f/u CAP with neg FOB by [name] 04/12/24  Admit date: 04/11/2024 Discharge date: 04/14/2024   Admitted From: Home Disposition:  Home   Discharge Condition:Stable CODE STATUS:FULL Diet recommendation: Heart Healthy   Brief/Interim Summary: Patient is a 68 year old female with history of COPD, tobacco abuse, hypertension, hyperlipidemia, pernicious anemia who presented with complaint of right-sided chest pain, nausea, vomiting. Quit smoking about a month ago after 30-pack-year history. Chest pain was stabbing in nature. On presentation, she was hemodynamically stable, saturating fine on room air. Lab work showed WC count of 26.7, sodium of 127. CT chest was negative for PE, aortic dissection but showed complete collapse of right middle lobe with luminal narrowing of the right middle lobe bronchus with suspected mucus impaction versus endobronchial lesion. Patient was started on ceftriaxone , azithromycin . PCCM consulted for bronchoscopy.  Found to have mucous plug.  Currently significantly improved clinically.  Right-sided chest pain has almost resolved.  She feels ready to go home.  Medically stable for discharge.   Following problems were addressed during the hospitalization:    Right middle lobe opacity/collapse/right-sided chest pain: CT chest as above.  Suspected obstructive pneumonia versus mucus impaction versus endobronchial lesion.  PCCM consulted.  Underwent bronchoscopy, found to have mucous plug. Respiratory viral panel showed rhinovirus/enterovirus. Added hypertonic saline and flutter valve  to help prevent additional mucous plugging.  BAL did not show any organisms. Culture no growth till date. Antibiotics changed to oral.   Intractable nausea and vomiting: Etiology unclear.  Started on PPI.  CT  abdomen/pelvis negative for bowel obstruction or inflammation.  Showed mild periportal edema, trace pericholecystic fluid.  LFTs normal.  Lipase level normal.    Right upper quadrant ultrasound showed  trace amount of pericholecystic fluid .  Nausea/vomiting resolved   Elevated leukocytes: Could be associated with pneumonia.  Mild elevated lactic acid level on presentation.  Procalcitonin elevated.    Leukocytosis resolved   Atypical chest pain: Troponins unremarkable.  Echocardiogram showed EF of 60 to 60%, grade 1 diastolic dysfunction, no regional wall motion abnormalities. Right-sided chest pain improved .continue lidocaine  patch.  Unclear etiology for right-sided chest pain but could be associated with right middle lung collapse.  No rib fractures or any other acute findings on the CT imaging   Hyperlipidemia:Continue statin   COPD/past smoker: Continue bronchodilators as needed.  Currently on room air.  30-pack-year history of smoking.  Recently quit. She needs  lung cancer screening, and consideration of PFTs as an outpatient for findings of emphysema on chest CT   Hyponatremia: Improved with IV fluid         Discharge Diagnoses:  Principal Problem:   Acute respiratory failure with hypoxia (HCC)   Obstructive pneumonia   Hyponatremia   Atypical chest pain   Intractable vomiting   Abnormal CT of the chest   History of tobacco abuse   Pneumonia of right middle lobe due to infectious organism      Seen by Carlyon [name] for CAP with ? RML obst > neg fob 04/12/24 mucus only    History of Present Illness  06/04/2024  Pulmonary/ 1st office eval/ [name] / [place] [facility] with GOLD 1 copd by [facility]fts 2014  Chief Complaint  Patient presents with   Establish Care  Hosp f/u  Collapse pockets and fluid on lungs   Dyspnea:  yardwork back to baseline  Cough: minimal in am clear  Sleep: bed is flat / 1 pillow s resp cc  SABA use: none  02: none     No obvious day to day or daytime  pattern/variability or assoc excess/ purulent sputum or mucus plugs or hemoptysis or cp or chest tightness, subjective wheeze or overt sinus or hb symptoms.    Also denies any obvious fluctuation of symptoms with weather or environmental changes or other aggravating or alleviating factors except as outlined above   No unusual exposure hx or h/o childhood pna/ asthma or knowledge of premature birth.  Current Allergies, Complete Past Medical History, Past Surgical History, Family History, and Social History were reviewed in [employer] record.  ROS  The following are not active complaints unless bolded Hoarseness, sore throat, dysphagia, dental problems, itching, sneezing,  nasal congestion or discharge of excess mucus or purulent secretions, ear ache,   fever, chills, sweats, unintended wt loss or wt gain, classically pleuritic or exertional cp,  orthopnea pnd or arm/hand swelling  or leg swelling, presyncope, palpitations, abdominal pain, anorexia, nausea, vomiting, diarrhea  or change in bowel habits or change in bladder habits, change in stools or change in urine, dysuria, hematuria,  rash, arthralgias, visual complaints, headache, numbness, weakness or ataxia or problems with walking or coordination,  change in mood or  memory.            Outpatient Medications Prior to Visit  Medication Sig Dispense Refill   alendronate (FOSAMAX) 70 MG tablet Take 70 mg by mouth once a week.     EPINEPHrine  0.3 mg/0.3 mL IJ SOAJ injection Inject 0.3 mg into the muscle as needed for anaphylaxis. 1 each 0   ergocalciferol (VITAMIN D2) 1.25 MG (50000 UT) capsule Take 50,000 Units by mouth once a week.     ibuprofen (ADVIL,MOTRIN) 800 MG tablet Take 800 mg by mouth every 8 (eight) hours as needed for moderate pain or headache.     LINZESS  145 MCG CAPS capsule Take 145 mcg by mouth daily as needed for constipation.     Multiple Vitamins-Minerals (MULTIVITAMIN GUMMIES ADULTS PO) Take 4  tablets by mouth daily.     omeprazole (PRILOSEC) 20 MG capsule Take 20 mg by mouth 2 (two) times daily as needed (heartburn).     ondansetron  (ZOFRAN -ODT) 4 MG disintegrating tablet Take 1 tablet (4 mg total) by mouth every 8 (eight) hours as needed for nausea or vomiting. 20 tablet 0   rizatriptan (MAXALT) 10 MG tablet Take 10 mg by mouth as needed for migraine.     simvastatin  (ZOCOR ) 40 MG tablet Take 40 mg by mouth at bedtime.     zolpidem  (AMBIEN ) 10 MG tablet Take 10 mg by mouth at bedtime as needed for sleep.     lidocaine  4 % Place 1 patch onto the skin daily. Apply on painful area of right chest (Patient not taking: Reported on 06/04/2024) 20 patch 0   simethicone (MYLICON) 80 MG chewable tablet Chew 80 mg by mouth 3 (three) times daily as needed for flatulence. (Patient not taking: Reported on 06/04/2024)     No facility-administered medications prior to visit.    Past Medical History:  Diagnosis Date   History of nuclear stress test 06/11/2011   lexiscan; normal pattern of perfusion, low risk scan    Syncope       Objective:  BP 116/64   Pulse (!) 57   Ht 5' 5 (1.651 m)   Wt 123 lb 12.8 oz (56.2 kg)   SpO2 98% Comment: ra  BMI 20.60 kg/m   SpO2: 98 % (ra) amb wf nad    HEENT : Oropharynx  clear/ full dentures     Nasal turbinates nl    NECK :  without  apparent JVD/ palpable Nodes/TM    LUNGS: no acc muscle use,  Nl contour chest which is clear to A and P bilaterally without cough on insp or exp maneuvers   CV:  RRR  no s3 or murmur or increase in P2, and no edema   ABD:  soft and nontender   MS:  Gait nl   ext warm without deformities Or obvious joint restrictions  calf tenderness, cyanosis or clubbing    SKIN: warm and dry without lesions    NEURO:  alert, approp, nl sensorium with  no motor or cerebellar deficits apparent.     I personally reviewed   radiology impression as follows:  CXR:   pa and lateral  05/14/24  Minimal middle lobe  opacities, linear on the lateral, favor atelectasis which is present on prior CT and may be chronic.      Assessment   Assessment & Plan Pneumonia of right middle lobe due to infectious organism Rinovirus positive severe tracheobronchitis  with  acute right middle lobe collapse from retained secretions resolved with abx and now off cigarettes(presumed dual cause of such poor MC clearance) - Seen by Carlyon Gaskins for CAP with ? RML obst > neg fob 04/12/24 mucus only  - minimal linear atx on f/u cxr at [facility] on 05/14/24   Rec: >>>  maintain off cigs and resume annual LDSCT as per present guidelines not due until 03/2025 but then continue yearly until age 21.  >>> F/u per PCP and here prn   Discussed in detail all the  indications, usual  risks and alternatives  relative to the benefits with patient who agrees to proceed with conservative f/u as outlined       Each maintenance medication was reviewed in detail including emphasizing most importantly the difference between maintenance and prns and under what circumstances the prns are to be triggered using an action plan format where appropriate.  Total time for H and P, chart review, counseling, and generating customized AVS unique to this office visit / same day charting = 42  min summary final f/u          AVS  Patient Instructions  Congratulations on the smoking cessation - it's the most important aspect of your care  Low-dose CT lung cancer screening is recommended for patients who are 27-70 years of age with a 20+ pack-year history of smoking and who are currently smoking or quit <=15 years ago. No coughing up blood  No unintentional weight loss of > 15 pounds in the last 6 months - pt is eligible for scanning yearly until  you reach 80 by present standards and I prefer that to be thru your PCP and follow up here is as needed        Ozell America, MD 06/04/2024

## 2024-06-04 ENCOUNTER — Encounter: Payer: Self-pay | Admitting: Internal Medicine

## 2024-06-04 ENCOUNTER — Ambulatory Visit: Admitting: Internal Medicine

## 2024-06-04 VITALS — BP 116/64 | HR 57 | Ht 65.0 in | Wt 123.8 lb

## 2024-06-04 DIAGNOSIS — Z87891 Personal history of nicotine dependence: Secondary | ICD-10-CM

## 2024-06-04 DIAGNOSIS — J189 Pneumonia, unspecified organism: Secondary | ICD-10-CM | POA: Diagnosis not present

## 2024-06-04 NOTE — Patient Instructions (Addendum)
 Congratulations on the smoking cessation - it's the most important aspect of your care  Low-dose CT lung cancer screening is recommended for patients who are 61-69 years of age with a 20+ pack-year history of smoking and who are currently smoking or quit <=15 years ago. No coughing up blood  No unintentional weight loss of > 15 pounds in the last 6 months - pt is eligible for scanning yearly until  you reach 80 by present standards and I prefer that to be thru your PCP and follow up here is as needed

## 2024-06-04 NOTE — Assessment & Plan Note (Addendum)
 Rinovirus positive severe tracheobronchitis  with  acute right middle lobe collapse from retained secretions resolved with abx and now off cigarettes(presumed dual cause of such poor MC clearance) - Seen by Carlyon Gaskins for CAP with ? RML obst > neg fob 04/12/24 mucus only  - minimal linear atx on f/u cxr at [facility] on 05/14/24   Rec: >>>  maintain off cigs and resume annual LDSCT as per present guidelines not due until 03/2025 but then continue yearly until age 27.  >>> F/u per PCP and here prn   Discussed in detail all the  indications, usual  risks and alternatives  relative to the benefits with patient who agrees to proceed with conservative f/u as outlined       Each maintenance medication was reviewed in detail including emphasizing most importantly the difference between maintenance and prns and under what circumstances the prns are to be triggered using an action plan format where appropriate.  Total time for H and P, chart review, counseling, and generating customized AVS unique to this office visit / same day charting = 42  min summary final f/u
